# Patient Record
Sex: Female | Born: 1960 | Race: White | Hispanic: No | Marital: Married | Smoking: Never smoker
Health system: Southern US, Community
[De-identification: ages and names within clinical notes are randomized; demographics above are authoritative.]

## PROBLEM LIST (undated history)

## (undated) DIAGNOSIS — I10 Essential (primary) hypertension: Secondary | ICD-10-CM

## (undated) DIAGNOSIS — N951 Menopausal and female climacteric states: Secondary | ICD-10-CM

## (undated) HISTORY — DX: Essential (primary) hypertension: I10

## (undated) HISTORY — DX: Menopausal and female climacteric states: N95.1

---

## 1982-10-01 HISTORY — PX: KNEE ARTHROSCOPY W/ ACL RECONSTRUCTION: SHX1858

## 1995-12-31 HISTORY — PX: TUBAL LIGATION: SHX77

## 2002-01-15 ENCOUNTER — Other Ambulatory Visit: Admission: RE | Admit: 2002-01-15 | Discharge: 2002-01-15 | Payer: Self-pay | Admitting: Obstetrics and Gynecology

## 2003-02-05 ENCOUNTER — Other Ambulatory Visit: Admission: RE | Admit: 2003-02-05 | Discharge: 2003-02-05 | Payer: Self-pay | Admitting: Obstetrics and Gynecology

## 2003-10-02 HISTORY — PX: KNEE ARTHROSCOPY: SUR90

## 2004-02-29 ENCOUNTER — Other Ambulatory Visit: Admission: RE | Admit: 2004-02-29 | Discharge: 2004-02-29 | Payer: Self-pay | Admitting: Internal Medicine

## 2004-03-10 ENCOUNTER — Encounter: Admission: RE | Admit: 2004-03-10 | Discharge: 2004-03-10 | Payer: Self-pay | Admitting: Internal Medicine

## 2005-05-28 ENCOUNTER — Other Ambulatory Visit: Admission: RE | Admit: 2005-05-28 | Discharge: 2005-05-28 | Payer: Self-pay | Admitting: Obstetrics and Gynecology

## 2006-08-20 ENCOUNTER — Other Ambulatory Visit: Admission: RE | Admit: 2006-08-20 | Discharge: 2006-08-20 | Payer: Self-pay | Admitting: Obstetrics and Gynecology

## 2007-08-07 ENCOUNTER — Other Ambulatory Visit: Admission: RE | Admit: 2007-08-07 | Discharge: 2007-08-07 | Payer: Self-pay | Admitting: Internal Medicine

## 2007-09-01 ENCOUNTER — Encounter: Admission: RE | Admit: 2007-09-01 | Discharge: 2007-09-01 | Payer: Self-pay | Admitting: Internal Medicine

## 2008-10-01 HISTORY — PX: ENDOMETRIAL ABLATION W/ NOVASURE: SUR434

## 2008-10-26 ENCOUNTER — Ambulatory Visit: Payer: Self-pay | Admitting: Diagnostic Radiology

## 2008-10-26 ENCOUNTER — Ambulatory Visit (HOSPITAL_BASED_OUTPATIENT_CLINIC_OR_DEPARTMENT_OTHER): Admission: RE | Admit: 2008-10-26 | Discharge: 2008-10-26 | Payer: Self-pay | Admitting: Obstetrics and Gynecology

## 2008-11-16 ENCOUNTER — Ambulatory Visit (HOSPITAL_COMMUNITY): Admission: RE | Admit: 2008-11-16 | Discharge: 2008-11-16 | Payer: Self-pay | Admitting: Obstetrics and Gynecology

## 2008-11-16 ENCOUNTER — Encounter (INDEPENDENT_AMBULATORY_CARE_PROVIDER_SITE_OTHER): Payer: Self-pay | Admitting: Obstetrics and Gynecology

## 2009-04-12 ENCOUNTER — Encounter: Admission: RE | Admit: 2009-04-12 | Discharge: 2009-04-12 | Payer: Self-pay | Admitting: Cardiology

## 2009-10-27 ENCOUNTER — Ambulatory Visit (HOSPITAL_BASED_OUTPATIENT_CLINIC_OR_DEPARTMENT_OTHER): Admission: RE | Admit: 2009-10-27 | Discharge: 2009-10-27 | Payer: Self-pay | Admitting: Obstetrics and Gynecology

## 2009-10-27 ENCOUNTER — Ambulatory Visit: Payer: Self-pay | Admitting: Diagnostic Radiology

## 2009-12-30 IMAGING — CT CT ANGIO ABDOMEN
2 of 8 series · 13 of 36 positions shown, 19 images · IV contrast ([ID] OMNI [id number])
Comparison: None.

CTA ABDOMEN

CLINICAL DATA: Uncontrolled renal vascular hypertension despite
medications

CT ANGIOGRAPHY ABDOMEN
TECHNIQUE: Multidetector CT imaging of the abdomen was performed
using the standard protocol during bolus administration of
intravenous contrast. Multiplanar reconstructed images including
MIPs were obtained and reviewed to evaluate the vascular anatomy.
Contrast: 100 ml Omnipaque 350

[Series 5: arterial, portal venous · axial · arterial · 0.62mm/px · z∈[-260,-55]mm · 9 of 154 slices shown, 15 images]
[im 16/154  soft-tissue]
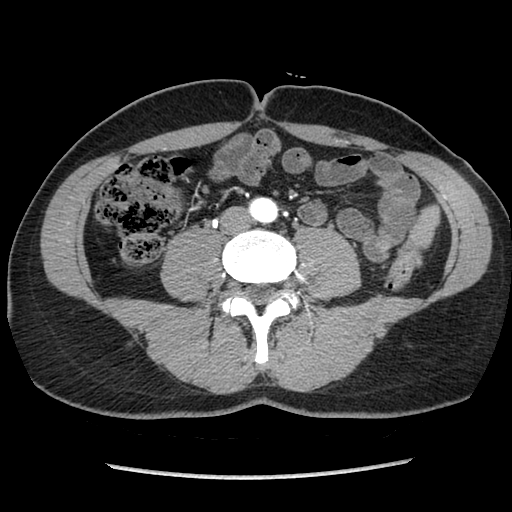
[im 16/154  bone]
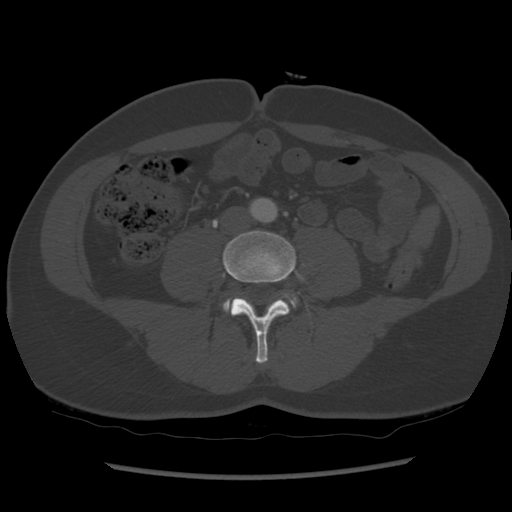
[im 31/154  soft-tissue]
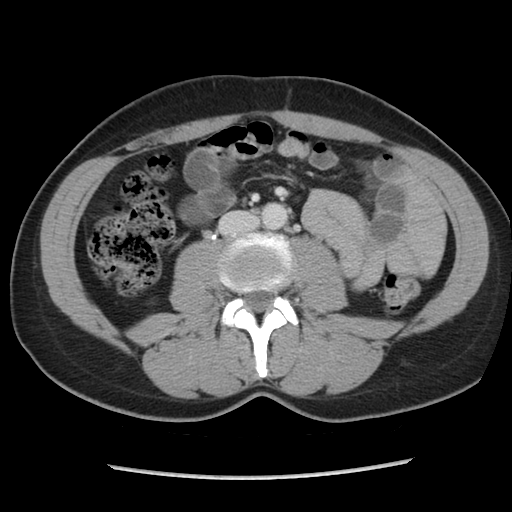
[im 46/154  soft-tissue]
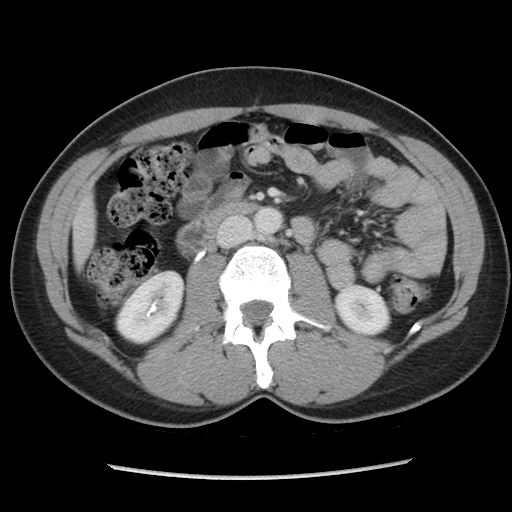
[im 62/154  soft-tissue]
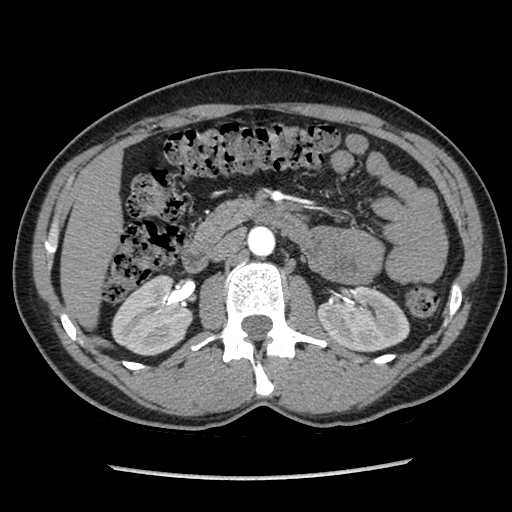
[im 77/154  soft-tissue]
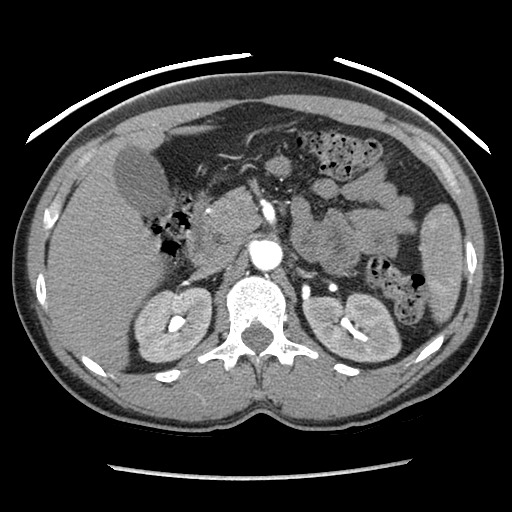
[im 92/154  soft-tissue]
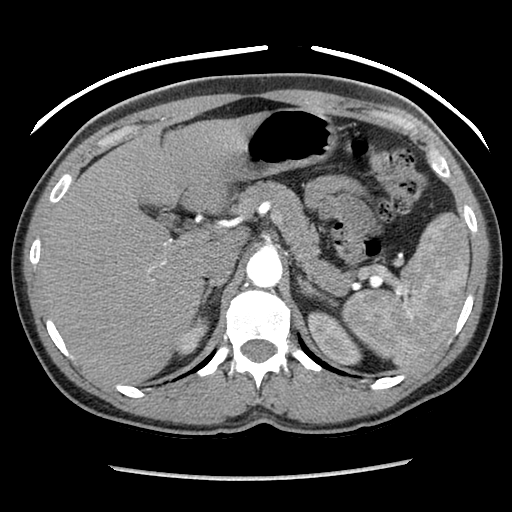
[im 92/154  lung]
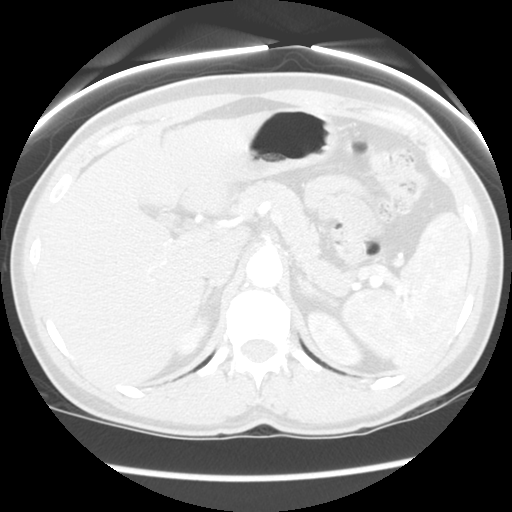
[im 108/154  soft-tissue]
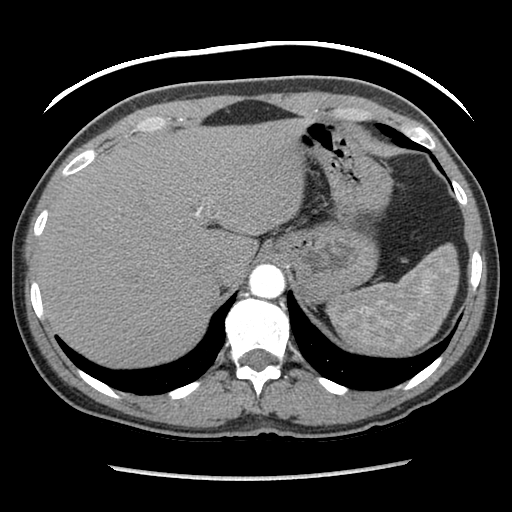
[im 108/154  lung]
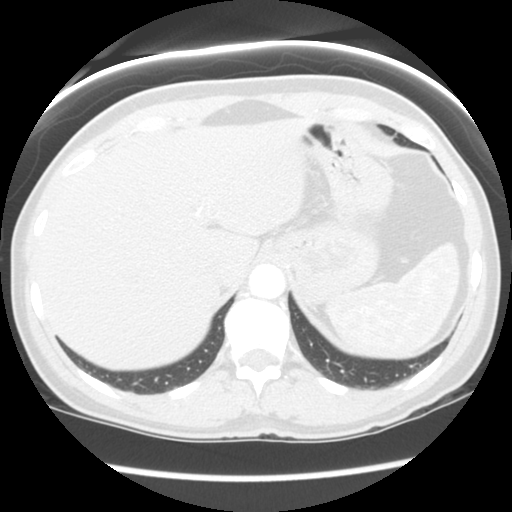
[im 123/154  soft-tissue]
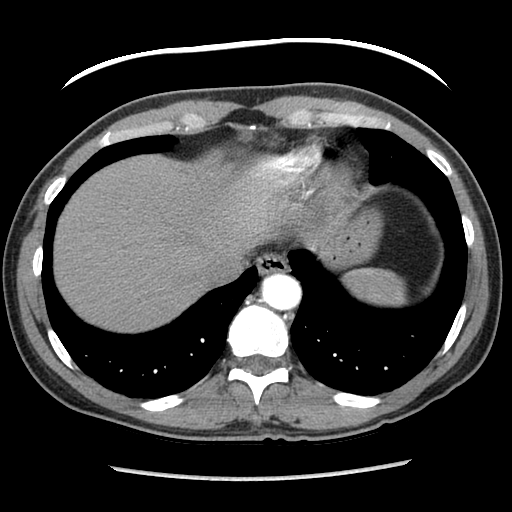
[im 123/154  lung]
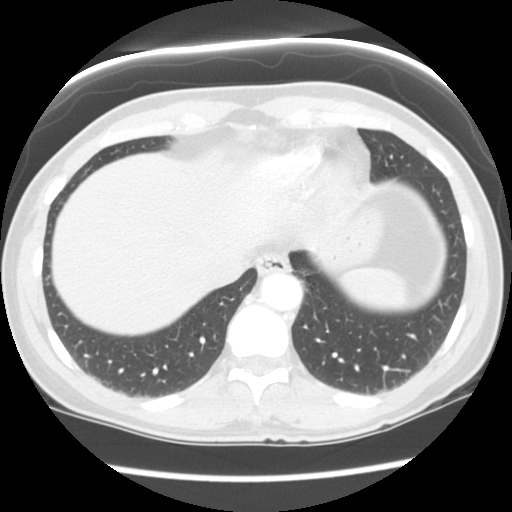
[im 138/154  soft-tissue]
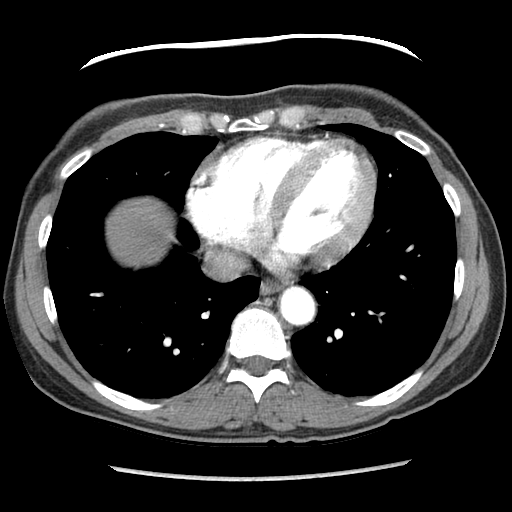
[im 138/154  lung]
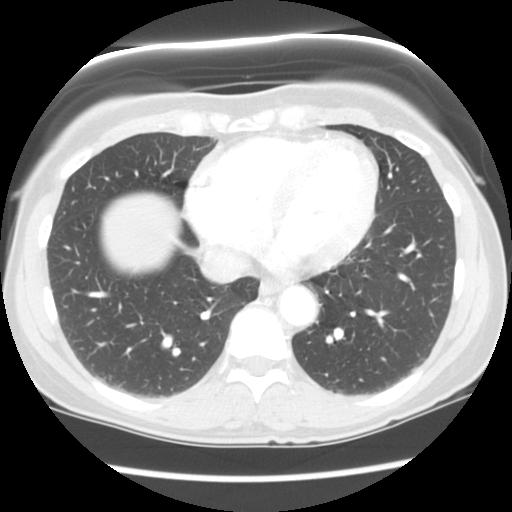
[im 138/154  bone]
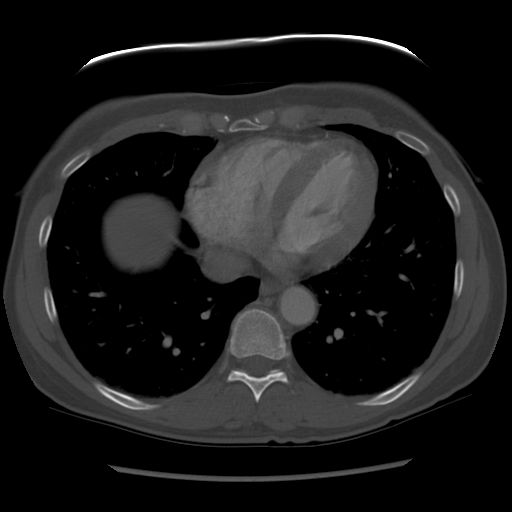

[Series 602: sagittal body · sagittal · 0.62mm/px · 4 of 129 slices shown]
[im 19/129  soft-tissue]
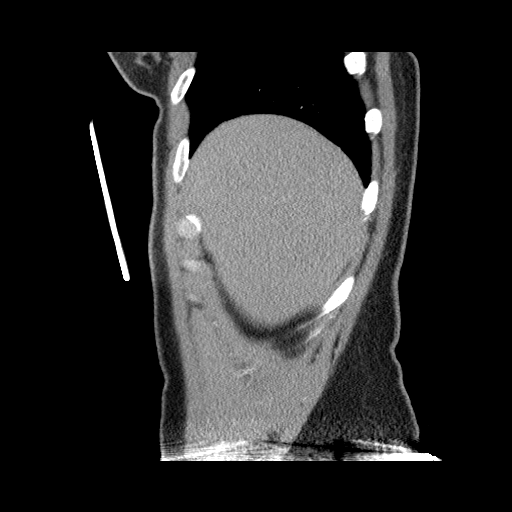
[im 37/129  soft-tissue]
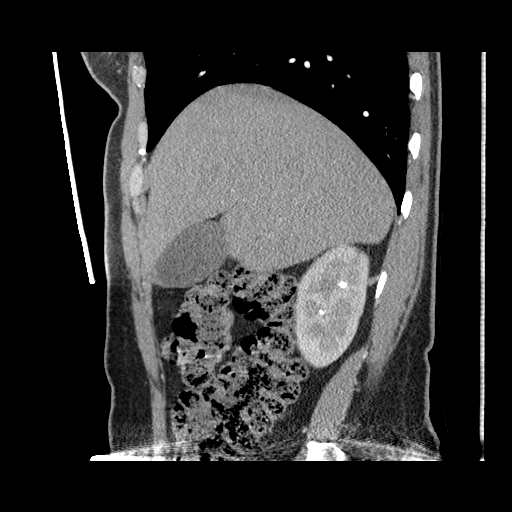
[im 55/129  soft-tissue]
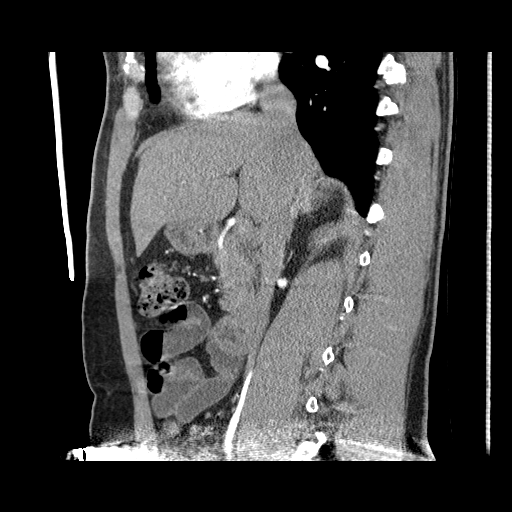
[im 74/129  soft-tissue]
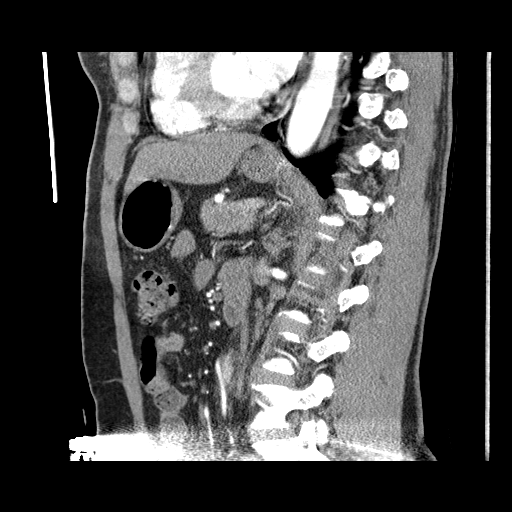

[13 of 36 positions shown; findings below may reference images not displayed]

FINDINGS: Abdominal aorta is patent and normal in caliber.  No
significant atherosclerosis.  Negative for aortic aneurysm,
dissection, occlusive disease, or retroperitoneal hemorrhage.  The
celiac, SMA, and IMA are all patent.  No evidence of proximal
visceral vascular disease.

There are single patent renal arteries.  No evidence of proximal or
ostial renal artery stenosis.  No accessory renal artery
demonstrated.  Kidneys are symmetric in size shape and position.
No renal asymmetry or atrophy.

Additional axial imaging demonstrates no acute finding.  The liver,
gallbladder, biliary system, adrenal glands, pancreas, spleen, and
kidneys are within normal limits for age.  No bowel obstruction,
dilatation, ileus pattern, or free air.

 Review of the MIP images confirms the above findings.
IMPRESSION: Negative for renal artery stenosis by CTA.  Widely patent single
renal arteries.
No significant abdominal atherosclerosis
No acute intra-abdominal finding.

## 2010-10-19 ENCOUNTER — Other Ambulatory Visit (HOSPITAL_BASED_OUTPATIENT_CLINIC_OR_DEPARTMENT_OTHER): Payer: Self-pay | Admitting: Obstetrics and Gynecology

## 2010-10-19 DIAGNOSIS — Z0000 Encounter for general adult medical examination without abnormal findings: Secondary | ICD-10-CM

## 2010-10-22 ENCOUNTER — Encounter: Payer: Self-pay | Admitting: Internal Medicine

## 2010-11-20 ENCOUNTER — Ambulatory Visit (HOSPITAL_BASED_OUTPATIENT_CLINIC_OR_DEPARTMENT_OTHER)
Admission: RE | Admit: 2010-11-20 | Discharge: 2010-11-20 | Disposition: A | Discharge status: Home / Self Care | Payer: 59 | Source: Ambulatory Visit | Attending: Obstetrics and Gynecology | Admitting: Obstetrics and Gynecology

## 2010-11-20 DIAGNOSIS — Z1231 Encounter for screening mammogram for malignant neoplasm of breast: Secondary | ICD-10-CM | POA: Insufficient documentation

## 2010-11-20 DIAGNOSIS — Z0000 Encounter for general adult medical examination without abnormal findings: Secondary | ICD-10-CM

## 2011-01-16 LAB — BASIC METABOLIC PANEL
Chloride: 100 mEq/L (ref 96–112)
Creatinine, Ser: 0.92 mg/dL (ref 0.4–1.2)
GFR calc Af Amer: >60 mL/min (ref >60)
GFR calc non Af Amer: >60 mL/min (ref >60)

## 2011-01-16 LAB — CBC
HCT: 40.2 % (ref 36.0–46.0)
Hemoglobin: 13.5 g/dL (ref 12.0–15.0)
MCV: 85.2 fL (ref 78.0–100.0)
RBC: 4.71 MIL/uL (ref 3.87–5.11)
WBC: 7.3 10*3/uL (ref 4.0–10.5)

## 2011-02-12 ENCOUNTER — Other Ambulatory Visit: Payer: Self-pay | Admitting: Cardiology

## 2011-02-12 DIAGNOSIS — I10 Essential (primary) hypertension: Secondary | ICD-10-CM

## 2011-02-13 NOTE — Op Note (Signed)
NAME:  Darlene Hawkins, Darlene Hawkins NO.:  [id number]   MEDICAL RECORD NO.:  [id number]         PATIENT TYPE:  WAMB   LOCATION:                                FACILITY:  [facility]   PHYSICIAN:  Malva Limes, M.D.    DATE OF BIRTH:  10-23-1960   DATE OF PROCEDURE:  DATE OF DISCHARGE:  10/26/2008                               OPERATIVE REPORT   PREOPERATIVE DIAGNOSES:  1. Menorrhagia.  2. Uterine fibroids.   POSTOPERATIVE DIAGNOSES:  1. Menorrhagia.  2. Uterine fibroids.   PROCEDURE:  1. Hysteroscopy.  2. Dilation and curettage.  3. NovaSure endometrial ablation.   SURGEON:  Anderson   ANESTHESIA:  MAC with paracervical block.   DRAINS:  None.   SPECIMENS:  Endometrial curettings sent to Pathology.   COMPLICATIONS:  None.   ANTIBIOTIC:  Ancef 1 g.   PROCEDURE:  The patient was taken to the operating room where she was  placed in dorsal supine position.  MAC anesthesia was administered.  She  was then placed in dorsal lithotomy position.  She was prepped and  draped in usual fashion for this procedure.  An exam under anesthesia  revealed an anteverted uterus of normal size and shape.  The patient had  previously been noted to have submucous fibroids on ultrasound.  At this  point the cervix was serially dilated to 27-French.  The uterus sounded  to 9 cm.  The hysteroscope was advanced through the endocervical canal  where a small endocervical polyp was seen.  On entering the uterine  cavity, both ostia were visualized with ease.  The patient also had 2  small endometrial polyps.  At this point the hysteroscope was removed  and D&C performed without difficulty.  Specimens were then handed off to  be sent to Pathology.  A NovaSure device was placed into the uterine  cavity and the cervical length was 3 cm given a cavitary length of 6 cm.  The device was opened to 4 cm in width. A seal test was performed and  did not pass.  A Vaseline gauze was placed, around the round  the os and  again this did not allow the pressure test to succeed.  At this point,  this device was removed.  A second device was placed and passed.  The  device was turned on for a total of 48 seconds.  The patient tolerated  the procedure well.  The device was removed.  Hysteroscope was placed  back into the uterine cavity and  appeared to have a very adequate ablation.  At this point the patient  was awoken and taken to recovery room in stable condition.  Instrument  and lab counts were correct x1.  The patient will be discharged to home.  She will follow up in the office in 4 weeks.  The patient will be sent  home with Percocet to take p.r.n.           ______________________________  Malva Limes, M.D.     MA/MEDQ  D:  11/16/2008  T:  11/16/2008  Job:  [id number]

## 2011-02-20 ENCOUNTER — Other Ambulatory Visit: Payer: Self-pay | Admitting: Obstetrics and Gynecology

## 2011-03-21 ENCOUNTER — Other Ambulatory Visit: Payer: Self-pay | Admitting: Cardiology

## 2011-08-29 ENCOUNTER — Other Ambulatory Visit: Payer: Self-pay | Admitting: Nurse Practitioner

## 2011-08-29 ENCOUNTER — Ambulatory Visit (INDEPENDENT_AMBULATORY_CARE_PROVIDER_SITE_OTHER): Payer: 59 | Admitting: Internal Medicine

## 2011-08-29 ENCOUNTER — Encounter: Payer: Self-pay | Admitting: Internal Medicine

## 2011-08-29 VITALS — BP 104/70 | HR 71 | Temp 98.9°F | Ht 62.5 in | Wt 155.0 lb

## 2011-08-29 DIAGNOSIS — E559 Vitamin D deficiency, unspecified: Secondary | ICD-10-CM

## 2011-08-29 DIAGNOSIS — N951 Menopausal and female climacteric states: Secondary | ICD-10-CM

## 2011-08-29 DIAGNOSIS — R454 Irritability and anger: Secondary | ICD-10-CM

## 2011-08-29 DIAGNOSIS — Z780 Asymptomatic menopausal state: Secondary | ICD-10-CM

## 2011-08-29 LAB — CBC WITH DIFFERENTIAL/PLATELET
Basophils Relative: 1 % (ref 0–1)
HCT: 44 % (ref 36.0–46.0)
Lymphocytes Relative: 33 % (ref 12–46)
MCHC: 33.4 g/dL (ref 30.0–36.0)
MCV: 87.6 fL (ref 78.0–100.0)
Neutro Abs: 5 10*3/uL (ref 1.7–7.7)
Platelets: 343 10*3/uL (ref 150–400)
RDW: 12.2 % (ref 11.5–15.5)
WBC: 8.7 10*3/uL (ref 4.0–10.5)

## 2011-08-29 LAB — LIPID PANEL
LDL Cholesterol: 169 mg/dL — ABNORMAL HIGH (ref 0–99)
Triglycerides: 177 mg/dL — ABNORMAL HIGH (ref <150)

## 2011-08-29 LAB — COMPREHENSIVE METABOLIC PANEL
BUN: 15 mg/dL (ref 6–23)
Chloride: 101 mEq/L (ref 96–112)
Potassium: 4.2 mEq/L (ref 3.5–5.3)
Sodium: 136 mEq/L (ref 135–145)
Total Protein: 7.4 g/dL (ref 6.0–8.3)

## 2011-08-29 MED ORDER — BUPROPION HCL ER (XL) 150 MG PO TB24: ORAL_TABLET | ORAL | Status: DC

## 2011-08-29 NOTE — Patient Instructions (Signed)
See me in February  Labs will be mailed to you  Take Wellbutrin once a day for 2 weeks and then can go to two times a day

## 2011-08-30 ENCOUNTER — Encounter: Payer: Self-pay | Admitting: Emergency Medicine

## 2011-08-30 ENCOUNTER — Encounter: Payer: Self-pay | Admitting: Internal Medicine

## 2011-08-30 DIAGNOSIS — I10 Essential (primary) hypertension: Secondary | ICD-10-CM | POA: Insufficient documentation

## 2011-08-30 DIAGNOSIS — N951 Menopausal and female climacteric states: Secondary | ICD-10-CM | POA: Insufficient documentation

## 2011-08-30 DIAGNOSIS — E559 Vitamin D deficiency, unspecified: Secondary | ICD-10-CM | POA: Insufficient documentation

## 2011-08-30 DIAGNOSIS — E785 Hyperlipidemia, unspecified: Secondary | ICD-10-CM | POA: Insufficient documentation

## 2011-08-30 LAB — VITAMIN D 25 HYDROXY (VIT D DEFICIENCY, FRACTURES): Vit D, 25-Hydroxy: 42 ng/mL (ref 30–89)

## 2011-08-30 NOTE — Progress Notes (Signed)
Subjective:    Patient ID: Darlene Hawkins, female    DOB: [birth year], 50 y.o.   MRN: [id number]  HPI New pt here for first visit.  PMH of HTN approx 3 years, menorrhagia tx with NOvasure, and Vitamin D deficiency.   Darlene Hawkins reports she has not had a menstrual lcycle in 2 years.  She has occasional hot flushes daytime only not at night  She is concerned over her irritable and angry moods over last 6-7 months.  She is depressed at times,  No anhedonia.  She gets short tempered at home and this bothers her.  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  She is wondering if Wellbutrin will help with mood.  She denies anxiety and she denies psychotic features.  She also describes low libido and has tried testosterone injections in the past but does not wish to continue this  She has Been Vit D deficient in the past and would like this chekced  No Known Allergies Past Medical History  Diagnosis Date  . Hypertension   . Menopause syndrome   . Vitamin D deficiency    Past Surgical History  Procedure Date  . Knee arthroscopy 2005    Left  . Cesarean section 6/95, 4/97  . Tubal ligation 4/97  . Endometrial ablation w/ novasure 2010  . Knee arthroscopy w/ acl reconstruction 1984    left   History   Social History  . Marital Status: Married    Spouse Name: N/A    Number of Children: N/A  . Years of Education: N/A   Occupational History  . Not on file.   Social History Main Topics  . Smoking status: Never Smoker   . Smokeless tobacco: Never Used  . Alcohol Use: 2.4 oz/week    4 Glasses of wine per week  . Drug Use: No  . Sexually Active: Yes    Birth Control/ Protection: Surgical   Other Topics Concern  . Not on file   Social History Narrative  . No narrative on file   Family History  Problem Relation Age of Onset  . Hypertension Mother   . Osteoporosis Mother   . Alcohol abuse Maternal  Grandfather   . Cirrhosis Maternal Grandfather   . Cancer Paternal Grandmother     lung  . Heart disease Paternal Grandfather    Patient Active Problem List  Diagnoses  . Hypertension  . Menopause syndrome  . Vitamin D deficiency   Current Outpatient Prescriptions on File Prior to Visit  Medication Sig Dispense Refill  . lisinopril (PRINIVIL,ZESTRIL) 5 MG tablet TAKE ONE TABLET BY MOUTH EVERY DAY  30 tablet  0       Review of Systems See HPI    Objective:   Physical Exam Physical Exam  Nursing note and vitals reviewed.  Constitutional: She is oriented to person, place, and time. She appears well-developed and well-nourished.  HENT:  Head: Normocephalic and atraumatic.  Cardiovascular: Normal rate and regular rhythm. Exam reveals no gallop and no friction rub.  No murmur heard.  Pulmonary/Chest: Breath sounds normal. She has no wheezes. She has no rales.  Neurological: She is alert and oriented to person, place, and time.  Skin: Skin is warm and dry.  Psychiatric: She has a normal mood and affect. Her behavior is normal.         Assessment & Plan:  1)  Irrtability/job stress.   Discussed SE profile of Wellbutrin.  She does not have a seizure disorder history.  Job stress  Certainly a component of irrtability but OK to try short course of WElllbutrin  150 mg  For two weeks then bid.  See me in 2-3 months.  Check TSH , labs today 2)   Vit D deficiency  Will check today 3) Menopause

## 2011-09-26 ENCOUNTER — Other Ambulatory Visit: Payer: Self-pay | Admitting: Emergency Medicine

## 2011-09-26 DIAGNOSIS — R454 Irritability and anger: Secondary | ICD-10-CM

## 2011-09-26 MED ORDER — BUPROPION HCL ER (XL) 150 MG PO TB24: ORAL_TABLET | ORAL | Status: DC

## 2011-09-26 NOTE — Telephone Encounter (Signed)
Darlene Hawkins called, requests refills of bupropion be sent to [facility] order, 90 day supply

## 2011-10-18 ENCOUNTER — Telehealth: Payer: Self-pay | Admitting: Emergency Medicine

## 2011-10-18 DIAGNOSIS — E785 Hyperlipidemia, unspecified: Secondary | ICD-10-CM

## 2011-10-18 NOTE — Telephone Encounter (Signed)
Darlene Hawkins called and wanted to know if she could come in fasting for her cholesterol check prior to her schedule afternoon CPE.  I told her I would enter the order and she could go to the lab at her convenience for lab draw.

## 2011-10-28 ENCOUNTER — Encounter: Payer: Self-pay | Admitting: Internal Medicine

## 2011-10-28 DIAGNOSIS — N920 Excessive and frequent menstruation with regular cycle: Secondary | ICD-10-CM | POA: Insufficient documentation

## 2011-11-03 LAB — LIPID PANEL
Cholesterol: 216 mg/dL — ABNORMAL HIGH (ref 0–200)
HDL: 44 mg/dL (ref >39)
LDL Cholesterol: 148 mg/dL — ABNORMAL HIGH (ref 0–99)
Total CHOL/HDL Ratio: 4.9 Ratio
Triglycerides: 120 mg/dL (ref <150)
VLDL: 24 mg/dL (ref 0–40)

## 2011-11-07 ENCOUNTER — Encounter: Payer: Self-pay | Admitting: Internal Medicine

## 2011-11-07 ENCOUNTER — Ambulatory Visit (INDEPENDENT_AMBULATORY_CARE_PROVIDER_SITE_OTHER): Payer: 59 | Admitting: Internal Medicine

## 2011-11-07 VITALS — BP 110/70 | HR 104 | Temp 99.4°F | Ht 62.5 in | Wt 154.1 lb

## 2011-11-07 DIAGNOSIS — E785 Hyperlipidemia, unspecified: Secondary | ICD-10-CM

## 2011-11-07 DIAGNOSIS — R635 Abnormal weight gain: Secondary | ICD-10-CM

## 2011-11-07 DIAGNOSIS — I10 Essential (primary) hypertension: Secondary | ICD-10-CM

## 2011-11-07 NOTE — Patient Instructions (Signed)
Will refer to Dr. Kinnie Scales  Return prn

## 2011-11-07 NOTE — Progress Notes (Signed)
  Subjective:    Patient ID: Darlene Hawkins, female    DOB: June 05, 1961, 51 y.o.   MRN: [id number]  HPI Darlene Hawkins is here for follow up.  She stopped the Wellbutrin because she felt it made her more forgetful and scattered.  She knows she needs to find a new job as this is very stressful for her.    See lipids.  They have improved from 2 months ago and she would like to try Fish oil instead of a prescription statin.  She also would like weight loss counseling  No Known Allergies Past Medical History  Diagnosis Date  . Hypertension   . Menopause syndrome   . Vitamin d deficiency    Past Surgical History  Procedure Date  . Knee arthroscopy 2005    Left  . Cesarean section 6/95, 4/97  . Tubal ligation 4/97  . Endometrial ablation w/ novasure 2010  . Knee arthroscopy w/ acl reconstruction 1984    left   History   Social History  . Marital Status: Married    Spouse Name: N/A    Number of Children: N/A  . Years of Education: N/A   Occupational History  . Not on file.   Social History Main Topics  . Smoking status: Never Smoker   . Smokeless tobacco: Never Used  . Alcohol Use: 2.4 oz/week    4 Glasses of wine per week  . Drug Use: No  . Sexually Active: Yes    Birth Control/ Protection: Surgical   Other Topics Concern  . Not on file   Social History Narrative  . No narrative on file   Family History  Problem Relation Age of Onset  . Hypertension Mother   . Osteoporosis Mother   . Alcohol abuse Maternal Grandfather   . Cirrhosis Maternal Grandfather   . Cancer Paternal Grandmother     lung  . Heart disease Paternal Grandfather    Patient Active Problem List  Diagnoses  . Hypertension  . Menopause syndrome  . Vitamin d deficiency  . Hyperlipidemia  . Menorrhagia   Current Outpatient Prescriptions on File Prior to Visit  Medication Sig Dispense Refill  . lisinopril (PRINIVIL,ZESTRIL) 5 MG tablet TAKE ONE TABLET BY MOUTH EVERY DAY  30 tablet  0  . buPROPion  (WELLBUTRIN XL) 150 MG 24 hr tablet Take one tablet every morning for 2 weeks then take one tablet twice a day  180 tablet  0       Review of Systems    see HPI Objective:   Physical Exam  Physical Exam  Nursing note and vitals reviewed.  Constitutional: She is oriented to person, place, and time. She appears well-developed and well-nourished.  HENT:  Head: Normocephalic and atraumatic.  Cardiovascular: Normal rate and regular rhythm. Exam reveals no gallop and no friction rub.  No murmur heard.  Pulmonary/Chest: Breath sounds normal. She has no wheezes. She has no rales.  Neurological: She is alert and oriented to person, place, and time.  Skin: Skin is warm and dry.  Psychiatric: She has a normal mood and affect. Her behavior is normal.        Assessment & Plan:  1)  Irrtiability:  No meds now.  She does not wish counseling for this now 2)  Hyperlipidemia:  OK to take fish oil for now, follow DASH diet  3)  Weight gain  Will refer to Dr. Kinnie Scales  4)  HTN  Well controlled

## 2011-12-26 ENCOUNTER — Telehealth: Payer: Self-pay | Admitting: Emergency Medicine

## 2011-12-26 NOTE — Telephone Encounter (Signed)
Darlene Hawkins called, left message yesterday.  She states that she was referred to Dr. Kinnie Scales for nutritional counseling, however, it will be difficult for her to get to his office in downtown [place] from her current job because she works in [employer].  She would like to know if [facility] has someone else she can recommend for her to see that is closer to the [facility] area?  Darlene Hawkins works and lives in [place].

## 2011-12-27 NOTE — Telephone Encounter (Signed)
Darlene Hawkins may have already spoke with pt.  Check with her to see if advised already

## 2012-03-05 ENCOUNTER — Encounter: Payer: Self-pay | Admitting: Cardiology

## 2014-07-09 ENCOUNTER — Other Ambulatory Visit: Payer: Self-pay | Admitting: Obstetrics and Gynecology

## 2014-07-12 LAB — CYTOLOGY - PAP

## 2014-08-02 ENCOUNTER — Encounter: Payer: Self-pay | Admitting: Internal Medicine

## 2015-08-04 ENCOUNTER — Telehealth: Payer: Self-pay | Admitting: Internal Medicine

## 2015-08-04 NOTE — Telephone Encounter (Signed)
Received records from [facility] for appointment on 08/18/15 with Dr Rennis GoldenHilty.  [facility]. lp

## 2015-08-18 ENCOUNTER — Encounter: Payer: Self-pay | Admitting: Internal Medicine

## 2015-08-18 ENCOUNTER — Ambulatory Visit (INDEPENDENT_AMBULATORY_CARE_PROVIDER_SITE_OTHER): Payer: 59 | Admitting: Internal Medicine

## 2015-08-18 VITALS — BP 118/94 | HR 73 | Ht 63.0 in | Wt 158.0 lb

## 2015-08-18 DIAGNOSIS — E785 Hyperlipidemia, unspecified: Secondary | ICD-10-CM

## 2015-08-18 DIAGNOSIS — I10 Essential (primary) hypertension: Secondary | ICD-10-CM | POA: Diagnosis not present

## 2015-08-18 NOTE — Patient Instructions (Signed)
Dr. Rennis GoldenHilty has ordered a coronary calcium score - this is done @ [address] - [address]  Your physician recommends that you schedule a follow-up appointment after your test.

## 2015-08-18 NOTE — Progress Notes (Signed)
OFFICE NOTE  Chief Complaint:  Abnormal cholesterol profile  Primary Care Physician: No PCP Per Patient  HPI:  Darlene Hawkins is a pleasant 54 year old female is coming referred to me by Dr. cousins for evaluation of an abnormal cholesterol profile. Her past medical history is limited to mild hypertension on low-dose lisinopril. There is a remote history of coronary disease in her grandfather but no first-degree relatives with any known coronary disease. She is generally physically active but has had an elevated cholesterol in the past. Recent cholesterol testing shows an LDL particle number of 1940, LDL-C of 171, age 75-C of 64, tragus rides 152 and total cholesterol 248. She reports being asymptomatic with exercise and denies any chest pain, shortness of breath or fatigue with exercise. She has no palpitations or any other worrisome symptoms.  PMHx:  Past Medical History  Diagnosis Date  . Hypertension   . Menopause syndrome   . Vitamin D deficiency     Past Surgical History  Procedure Laterality Date  . Knee arthroscopy  2005    Left  . Cesarean section  6/95, 4/97  . Tubal ligation  4/97  . Endometrial ablation w/ novasure  2010  . Knee arthroscopy w/ acl reconstruction  1984    left    FAMHx:  Family History  Problem Relation Age of Onset  . Hypertension Mother   . Osteoporosis Mother   . Alcohol abuse Maternal Grandfather   . Cirrhosis Maternal Grandfather   . Cancer Paternal Grandmother     lung  . Heart disease Paternal Grandfather     SOCHx:   reports that she has never smoked. She has never used smokeless tobacco. She reports that she drinks about 2.4 oz of alcohol per week. She reports that she does not use illicit drugs.  ALLERGIES:  No Known Allergies  ROS: A comprehensive review of systems was negative.  HOME MEDS: Current Outpatient Prescriptions  Medication Sig Dispense Refill  . lisinopril (PRINIVIL,ZESTRIL) 5 MG tablet TAKE ONE TABLET BY  MOUTH EVERY DAY 30 tablet 0   No current facility-administered medications for this visit.    LABS/IMAGING: No results found for this or any previous visit (from the past 48 hour(s)). No results found.  WEIGHTS: Wt Readings from Last 3 Encounters:  08/18/15 158 lb (71.668 kg)  11/07/11 154 lb 1.3 oz (69.89 kg)  08/29/11 155 lb (70.308 kg)    VITALS: BP 118/94 mmHg  Pulse 73  Ht  (1.6 m)  Wt 158 lb (71.668 kg)  BMI 28.00 kg/m2  EXAM: General appearance: alert and no distress Neck: no carotid bruit and no JVD Lungs: clear to auscultation bilaterally Heart: regular rate and rhythm, S1, S2 normal, no murmur, click, rub or gallop Abdomen: soft, non-tender; bowel sounds normal; no masses,  no organomegaly Extremities: extremities normal, atraumatic, no cyanosis or edema Pulses: 2+ and symmetric Skin: Skin color, texture, turgor normal. No rashes or lesions Neurologic: Grossly normal Psych: Pleasant  EKG: Normal sinus rhythm at 73  ASSESSMENT: 1. Hypertension-controlled 2. Dyslipidemia  PLAN: 1.   Mrs. Mantey has an elevated cholesterol profile with a high particle number of 1940 and LDL C of 171. Cardiac risk factors include age (postmenopausal) and hypertension which is well-controlled on low-dose lisinopril. We discussed the possible risks and benefits of treatment with statin therapy. It may be helpful to get more evidence as to whether or not she's developed any premature coronary artery disease. I offered a CT calcium  score, which may be helpful and minimally invasive. If this is zero (0), then we could argue to not be as aggressive with cholesterol treatment. If there are significant coronary artery calcifications, then more than moderate reduction of cholesterol would be indicated based on intermediate risk.  Plan to see her back to discuss the results of her coronary artery calcium score in the near future. In the meantime, I recommended dietary changes which may  be helpful in lowering cholesterol as well as restarting a more regular exercise program.   Thanks as always for the kind referral.  [name]. Andre Gallego, MD, [facility] CentersFACC Attending Cardiologist [facility]  [name] Hjalmar Ballengee 08/18/2015, 11:06 AM

## 2015-08-24 ENCOUNTER — Ambulatory Visit (INDEPENDENT_AMBULATORY_CARE_PROVIDER_SITE_OTHER)
Admission: RE | Admit: 2015-08-24 | Discharge: 2015-08-24 | Disposition: A | Discharge status: Home / Self Care | Payer: 59 | Source: Ambulatory Visit | Attending: Internal Medicine | Admitting: Internal Medicine

## 2015-08-24 DIAGNOSIS — I10 Essential (primary) hypertension: Secondary | ICD-10-CM

## 2015-08-24 DIAGNOSIS — E785 Hyperlipidemia, unspecified: Secondary | ICD-10-CM

## 2015-08-29 ENCOUNTER — Encounter: Payer: Self-pay | Admitting: Internal Medicine

## 2015-08-29 NOTE — Telephone Encounter (Signed)
Returning your call. °

## 2015-08-29 NOTE — Telephone Encounter (Signed)
Pt would like her test results from 08-24-15 please.

## 2015-08-29 NOTE — Telephone Encounter (Signed)
This encounter was created in error - please disregard.

## 2015-10-12 ENCOUNTER — Ambulatory Visit (INDEPENDENT_AMBULATORY_CARE_PROVIDER_SITE_OTHER): Payer: 59 | Admitting: Internal Medicine

## 2015-10-12 VITALS — BP 132/82 | HR 70 | Ht 63.0 in | Wt 157.0 lb

## 2015-10-12 DIAGNOSIS — I10 Essential (primary) hypertension: Secondary | ICD-10-CM

## 2015-10-12 DIAGNOSIS — R931 Abnormal findings on diagnostic imaging of heart and coronary circulation: Secondary | ICD-10-CM

## 2015-10-12 DIAGNOSIS — E785 Hyperlipidemia, unspecified: Secondary | ICD-10-CM

## 2015-10-12 DIAGNOSIS — Z79899 Other long term (current) drug therapy: Secondary | ICD-10-CM | POA: Diagnosis not present

## 2015-10-12 DIAGNOSIS — R938 Abnormal findings on diagnostic imaging of other specified body structures: Secondary | ICD-10-CM | POA: Diagnosis not present

## 2015-10-12 MED ORDER — ROSUVASTATIN CALCIUM 10 MG PO TABS: 10.0000 mg | ORAL_TABLET | Freq: Every day | ORAL | Status: DC

## 2015-10-12 MED ORDER — LISINOPRIL 5 MG PO TABS: 5.0000 mg | ORAL_TABLET | Freq: Every day | ORAL | Status: DC

## 2015-10-12 NOTE — Patient Instructions (Signed)
Your physician has recommended you make the following change in your medication: START CRESTOR 10 MG DAILY  Your physician recommends that you return for lab work in:   ONE WEEK CMET AT [facility]  3 MONTHS AT [facility] FASTING FOR NMR LIPID PANEL  Your physician recommends that you schedule a follow-up appointment in: 3-4 MONTHS WITH DR. HILTY

## 2015-10-13 ENCOUNTER — Encounter: Payer: Self-pay | Admitting: Internal Medicine

## 2015-10-13 DIAGNOSIS — R931 Abnormal findings on diagnostic imaging of heart and coronary circulation: Secondary | ICD-10-CM | POA: Insufficient documentation

## 2015-10-13 NOTE — Progress Notes (Signed)
OFFICE NOTE  Chief Complaint:  Follow-up coronary calcium scan  Primary Care Physician: Darlene Kyle, MD  HPI:  Darlene Hawkins is a pleasant 55 year old female is coming referred to me by Dr. cousins for evaluation of an abnormal cholesterol profile. Her past medical history is limited to mild hypertension on low-dose lisinopril. There is a remote history of coronary disease in her grandfather but no first-degree relatives with any known coronary disease. She is generally physically active but has had an elevated cholesterol in the past. Recent cholesterol testing shows an LDL particle number of 1940, LDL-Hawkins of 171, HDL 47, triglycerides 152 and total cholesterol 248. She reports being asymptomatic with exercise and denies any chest pain, shortness of breath or fatigue with exercise. She has no palpitations or any other worrisome symptoms.  Darlene Hawkins returns today for follow-up of her coronary artery calcium score. This demonstrated no extracardiac findings. She had a small punctate area of calcium in the mid LAD. The calcium score was 1.5, which is 78th percentile for age and sex matched controls. We discussed the relevance of some mild coronary artery calcium, which may not be that atypical given her age in the 39s, but does suggest coronary disease.  I think she would benefit from a 50% reduction in cholesterol given the fact that she has at least intermediate coronary risk was some evidence of coronary artery disease. I would therefore recommend starting a low-dose statin and increasing that as tolerated.  PMHx:  Past Medical History  Diagnosis Date  . Hypertension   . Menopause syndrome   . Vitamin D deficiency     Past Surgical History  Procedure Laterality Date  . Knee arthroscopy  2005    Left  . Cesarean section  6/95, 4/97  . Tubal ligation  4/97  . Endometrial ablation w/ novasure  2010  . Knee arthroscopy w/ acl reconstruction  1984    left    FAMHx:    Family History  Problem Relation Age of Onset  . Hypertension Mother   . Osteoporosis Mother   . Alcohol abuse Maternal Grandfather   . Cirrhosis Maternal Grandfather     liver cancer  . Lung cancer Paternal Grandmother   . Heart disease Paternal Grandfather     SOCHx:   reports that she has never smoked. She has never used smokeless tobacco. She reports that she drinks about 2.4 oz of alcohol per week. She reports that she does not use illicit drugs.  ALLERGIES:  No Known Allergies  ROS: A comprehensive review of systems was negative.  HOME MEDS: Current Outpatient Prescriptions  Medication Sig Dispense Refill  . lisinopril (PRINIVIL,ZESTRIL) 5 MG tablet Take 1 tablet (5 mg total) by mouth daily. 90 tablet 3  . rosuvastatin (CRESTOR) 10 MG tablet Take 1 tablet (10 mg total) by mouth daily. 90 tablet 3   No current facility-administered medications for this visit.    LABS/IMAGING: No results found for this or any previous visit (from the past 48 hour(s)). No results found.  WEIGHTS: Wt Readings from Last 3 Encounters:  10/12/15 157 lb (71.215 kg)  08/18/15 158 lb (71.668 kg)  11/07/11 154 lb 1.3 oz (69.89 kg)    VITALS: BP 132/82 mmHg  Pulse 70  Ht 5\' 3"  (1.6 m)  Wt 157 lb (71.215 kg)  BMI 27.82 kg/m2  EXAM: Deferred  EKG: Deferred  ASSESSMENT: 1. CAC of 1.5, mid LAD (2016) 2. Hypertension-controlled 3. Dyslipidemia  PLAN: 1.  Darlene Hawkins does have a small amount of coronary artery calcium. Given her intermediate cardiac risk, I would recommend treatment of her dyslipidemia with a goal of 50% reduction in cholesterol. Her most recent LDL was over 170. I would recommend starting Crestor 10 mg daily. We can increase that as necessary to reach a goal LDL of ideally less than 100. Plan tests of liver function in 1-2 weeks and a repeat lipid profile in 3 months.  Darlene NoseKenneth Hawkins. Darlene Voong, MD, [facility] Ltd[facility]ACC Attending Cardiologist [facility]  Darlene NoseKenneth Hawkins  Darlene Hawkins 10/13/2015, 11:23 AM

## 2015-10-20 LAB — COMPREHENSIVE METABOLIC PANEL
ALT: 22 U/L (ref 6–29)
AST: 23 U/L (ref 10–35)
Albumin: 4.4 g/dL (ref 3.6–5.1)
Alkaline Phosphatase: 60 U/L (ref 33–130)
BUN: 15 mg/dL (ref 7–25)
CHLORIDE: 103 mmol/L (ref 98–110)
CO2: 28 mmol/L (ref 20–31)
Calcium: 9.7 mg/dL (ref 8.6–10.4)
Creat: 1.01 mg/dL (ref 0.50–1.05)
GLUCOSE: 95 mg/dL (ref 65–99)
POTASSIUM: 4.6 mmol/L (ref 3.5–5.3)
Sodium: 139 mmol/L (ref 135–146)
Total Bilirubin: 0.7 mg/dL (ref 0.2–1.2)
Total Protein: 7.1 g/dL (ref 6.1–8.1)

## 2015-11-03 ENCOUNTER — Telehealth: Payer: Self-pay | Admitting: Internal Medicine

## 2015-11-03 NOTE — Telephone Encounter (Signed)
New message      Pt c/o medication issue:  1. Name of Medication: generic crestor 2. How are you currently taking this medication (dosage and times per day)? 10 mg daily  3. Are you having a reaction (difficulty breathing--STAT)? no  4. What is your medication issue?  Medication is making pt joints and muscle hurt at night, constipation----she does not like this medication

## 2015-11-03 NOTE — Telephone Encounter (Signed)
Could go one of two routes. Could restart Crestor at 10 mg twice weekly to see if she tolerates it, or try pravastatin, but you'd probably need 40 mg to get her LDL down.

## 2015-11-03 NOTE — Telephone Encounter (Signed)
Pt experiencing joint & leg pain, GI upset/constipation after 2 weeks on crestor. Advised OK to d/c - plan to be off for 2 weeks - we can make recommendation for lower dosage or different med.   Pt voiced understanding.  Routed for review.

## 2015-11-04 NOTE — Telephone Encounter (Signed)
Agree .. hold statin for 2 weeks. If symptoms better, would recommend pravastatin 40 mg daily if she was willing to take it.  Dr. Rexene Edison

## 2015-12-06 ENCOUNTER — Telehealth: Payer: Self-pay | Admitting: Internal Medicine

## 2015-12-06 NOTE — Telephone Encounter (Signed)
Left message for pt to call.

## 2015-12-06 NOTE — Telephone Encounter (Signed)
New message      Pt stopped her cholesterol medication weeks ago.  Should she have a cholesterol blood test?

## 2015-12-09 NOTE — Telephone Encounter (Signed)
Spoke to patient Patient states she has been off the Crestor for @ 2 weeks  Per patient states symptoms got better in 2-3 days. She prefer not to try another Statin medications at present time -diet and exercise, with use of fish oil. She would like to still have labs done in April 2017

## 2015-12-09 NOTE — Telephone Encounter (Signed)
Follow up ° ° ° ° ° °Returning a call to the nurse °

## 2015-12-12 ENCOUNTER — Telehealth: Payer: Self-pay | Admitting: Internal Medicine

## 2015-12-12 DIAGNOSIS — E785 Hyperlipidemia, unspecified: Secondary | ICD-10-CM

## 2015-12-12 NOTE — Telephone Encounter (Signed)
[facility] entered in orders, [facility]

## 2015-12-12 NOTE — Telephone Encounter (Signed)
New message      Calling to get order for lab work.  Need order for lipomed test. Is she supposed to have a cardiac IQ?  Please fax to [phone]

## 2016-01-07 LAB — CARDIO IQ(R) ADVANCED LIPID PANEL
Apolipoprotein B: 115 mg/dL — ABNORMAL HIGH (ref 49–103)
CHOLESTEROL/HDL RATIO (CARDIO IQ ADV LIPID PANEL): 5.1 calc — AB (ref <=5.0)
Cholesterol, Total: 233 mg/dL — ABNORMAL HIGH (ref 125–200)
HDL Cholesterol: 46 mg/dL (ref >=46)
LDL Large: 4885 nmol/L — ABNORMAL LOW (ref 5038–17886)
LDL Medium: 398 nmol/L — ABNORMAL HIGH (ref 121–397)
LDL Particle Number: 1664 nmol/L (ref 1016–2185)
LDL Peak Size: 212.1 Angstrom — ABNORMAL LOW (ref >=218.2)
LDL SMALL: 404 nmol/L — AB (ref 115–386)
LDL, Calculated: 159 mg/dL — ABNORMAL HIGH
Non-HDL Cholesterol: 187 mg/dL
TRIGLYCERIDES (CARDIO IQ ADV LIPID PANEL): 139 mg/dL

## 2016-01-11 ENCOUNTER — Telehealth: Payer: Self-pay | Admitting: Internal Medicine

## 2016-01-11 NOTE — Telephone Encounter (Signed)
New message ° ° ° ° ° °Returning a call to the nurse to get test results °

## 2016-01-11 NOTE — Telephone Encounter (Signed)
Patient called with lab results.

## 2016-01-19 ENCOUNTER — Encounter: Payer: Self-pay | Admitting: Internal Medicine

## 2016-01-19 ENCOUNTER — Ambulatory Visit (INDEPENDENT_AMBULATORY_CARE_PROVIDER_SITE_OTHER): Payer: 59 | Admitting: Internal Medicine

## 2016-01-19 VITALS — BP 112/94 | HR 86 | Ht 63.0 in | Wt 160.5 lb

## 2016-01-19 DIAGNOSIS — Z79899 Other long term (current) drug therapy: Secondary | ICD-10-CM

## 2016-01-19 DIAGNOSIS — I10 Essential (primary) hypertension: Secondary | ICD-10-CM | POA: Diagnosis not present

## 2016-01-19 DIAGNOSIS — R938 Abnormal findings on diagnostic imaging of other specified body structures: Secondary | ICD-10-CM

## 2016-01-19 DIAGNOSIS — E785 Hyperlipidemia, unspecified: Secondary | ICD-10-CM | POA: Diagnosis not present

## 2016-01-19 MED ORDER — PRAVASTATIN SODIUM 40 MG PO TABS: 40.0000 mg | ORAL_TABLET | Freq: Every evening | ORAL | Status: DC

## 2016-01-19 NOTE — Patient Instructions (Signed)
Your physician has recommended you make the following change in your medication...  1. START pravastatin 40mg  once daily 2. Take Co-Q10 100mg  once daily  Your physician recommends that you return for lab work in ONE WEEK - CK, CMET  Your physician recommends that you return for lab work in 3 months fasting to recheck cholesterol   Your physician wants you to follow-up in: 3 months with Dr. Rennis GoldenHilty. You will receive a reminder letter in the mail two months in advance. If you don't receive a letter, please call our office to schedule the follow-up appointment.

## 2016-01-19 NOTE — Progress Notes (Signed)
OFFICE NOTE  Chief Complaint:  Follow-up labs  Primary Care Physician: Serita Kyle, MD  HPI:  Darlene Hawkins is a pleasant 55 year old female is coming referred to me by Dr. cousins for evaluation of an abnormal cholesterol profile. Her past medical history is limited to mild hypertension on low-dose lisinopril. There is a remote history of coronary disease in her grandfather but no first-degree relatives with any known coronary disease. She is generally physically active but has had an elevated cholesterol in the past. Recent cholesterol testing shows an LDL particle number of 1940, LDL-C of 171, HDL 47, triglycerides 152 and total cholesterol 248. She reports being asymptomatic with exercise and denies any chest pain, shortness of breath or fatigue with exercise. She has no palpitations or any other worrisome symptoms.  Darlene Hawkins returns today for follow-up of her coronary artery calcium score. This demonstrated no extracardiac findings. She had a small punctate area of calcium in the mid LAD. The calcium score was 1.5, which is 78th percentile for age and sex matched controls. We discussed the relevance of some mild coronary artery calcium, which may not be that atypical given her age in the 20s, but does suggest coronary disease.  I think she would benefit from a 50% reduction in cholesterol given the fact that she has at least intermediate coronary risk was some evidence of coronary artery disease. I would therefore recommend starting a low-dose statin and increasing that as tolerated.  Darlene Hawkins returns today for follow-up. She recently had a repeat lipid profile which showed an elevated LDL-P over 1600 with LDL C close to 160. She has a lot of small to medium-sized LDL particles putting her at increased risk. When I spoke with her about Crestor she said she was able to take the medicine only a few days and then developed not only muscle pains but significant constipation which  caused her to discontinue the medicine. She is therefore been off of medicine for several months. Cholesterol profile looks essentially unchanged despite some dietary changes.  PMHx:  Past Medical History  Diagnosis Date  . Hypertension   . Menopause syndrome   . Vitamin D deficiency     Past Surgical History  Procedure Laterality Date  . Knee arthroscopy  2005    Left  . Cesarean section  6/95, 4/97  . Tubal ligation  4/97  . Endometrial ablation w/ novasure  2010  . Knee arthroscopy w/ acl reconstruction  1984    left    FAMHx:  Family History  Problem Relation Age of Onset  . Hypertension Mother   . Osteoporosis Mother   . Alcohol abuse Maternal Grandfather   . Cirrhosis Maternal Grandfather     liver cancer  . Lung cancer Paternal Grandmother   . Heart disease Paternal Grandfather     SOCHx:   reports that she has never smoked. She has never used smokeless tobacco. She reports that she drinks about 2.4 oz of alcohol per week. She reports that she does not use illicit drugs.  ALLERGIES:  No Known Allergies  ROS: A comprehensive review of systems was negative.  HOME MEDS: Current Outpatient Prescriptions  Medication Sig Dispense Refill  . lisinopril (PRINIVIL,ZESTRIL) 5 MG tablet Take 1 tablet (5 mg total) by mouth daily. 90 tablet 3  . pravastatin (PRAVACHOL) 40 MG tablet Take 1 tablet (40 mg total) by mouth every evening. 90 tablet 3   No current facility-administered medications for this visit.    LABS/IMAGING:  No results found for this or any previous visit (from the past 48 hour(s)). No results found.  WEIGHTS: Wt Readings from Last 3 Encounters:  01/19/16 160 lb 8 oz (72.802 kg)  10/12/15 157 lb (71.215 kg)  08/18/15 158 lb (71.668 kg)    VITALS: BP 112/94 mmHg  Pulse 86  Ht 5\' 3"  (1.6 m)  Wt 160 lb 8 oz (72.802 kg)  BMI 28.44 kg/m2  EXAM: Deferred  EKG: Deferred  ASSESSMENT: 1. CAC of 1.5, mid LAD  (2016) 2. Hypertension-controlled 3. Dyslipidemia  PLAN: 1.   Darlene Hawkins tried Crestor but had significant side effects. I'm still having that we try different statin, perhaps a natural statin such as pravastatin. I recommend starting 40 mg pravastatin daily. We'll also check a CK and compress metabolic profile one week and repeat a lipid profile in 3 months. I'll plan to see her back at that time to discuss it. Advised that she consider taking coenzyme Q10 100 mg daily with her statin.  Darlene NoseKenneth C. Kostas Marrow, MD, [facility] Attending Cardiologist [facility]  Darlene Hawkins 01/19/2016, 4:07 PM

## 2016-01-20 ENCOUNTER — Telehealth: Payer: Self-pay | Admitting: Internal Medicine

## 2016-01-20 MED ORDER — PRAVASTATIN SODIUM 40 MG PO TABS: 40.0000 mg | ORAL_TABLET | Freq: Every evening | ORAL | Status: DC

## 2016-01-20 NOTE — Telephone Encounter (Signed)
Follow up      Pt request to be called when the pravastatin has been called in to [facility].

## 2016-01-20 NOTE — Telephone Encounter (Signed)
Rx sent to requested pharmacy. Rx sent to previous pharmacy discontinued. Pt notified.

## 2016-01-20 NOTE — Telephone Encounter (Signed)
°*  STAT* If patient is at the pharmacy, call can be transferred to refill team.   1. Which medications need to be refilled? (please list name of each medication and dose if known) Pravastatin   2. Which pharmacy/location (including street and city if local pharmacy) is medication to be sent to? [facility] [address] [place]  3. Do they need a 30 day or 90 day supply? 30

## 2016-01-31 NOTE — Addendum Note (Signed)
Addended by: Evans LanceSTOVER, Council Munguia W on: 01/31/2016 05:13 PM   Modules accepted: Orders

## 2016-02-02 LAB — COMPREHENSIVE METABOLIC PANEL
ALT: 13 U/L (ref 6–29)
AST: 17 U/L (ref 10–35)
Albumin: 4.4 g/dL (ref 3.6–5.1)
Alkaline Phosphatase: 55 U/L (ref 33–130)
BUN: 23 mg/dL (ref 7–25)
CHLORIDE: 103 mmol/L (ref 98–110)
CO2: 26 mmol/L (ref 20–31)
Calcium: 9.5 mg/dL (ref 8.6–10.4)
Creat: 0.89 mg/dL (ref 0.50–1.05)
GLUCOSE: 91 mg/dL (ref 65–99)
POTASSIUM: 4.7 mmol/L (ref 3.5–5.3)
Sodium: 139 mmol/L (ref 135–146)
Total Bilirubin: 0.4 mg/dL (ref 0.2–1.2)
Total Protein: 6.9 g/dL (ref 6.1–8.1)

## 2016-02-02 LAB — CK: CK TOTAL: 71 U/L (ref 7–177)

## 2016-02-03 ENCOUNTER — Telehealth: Payer: Self-pay | Admitting: Internal Medicine

## 2016-02-03 NOTE — Telephone Encounter (Signed)
Pt is calling to see if her lab results are available . Please f/u with pt..  Thanks

## 2016-02-03 NOTE — Telephone Encounter (Signed)
Pt aware results pending, she verbalized she is OK w phone call next week.

## 2016-03-22 ENCOUNTER — Telehealth: Payer: Self-pay | Admitting: Internal Medicine

## 2016-03-22 NOTE — Telephone Encounter (Signed)
Discussed w patient. She has scheduled lab draw next week and understands we will call w lipid study results, and that she should still follow up at recommended interval. She cannot do July but states she will call back when she is aware of her August or September availability. Pt voiced thanks for the call.

## 2016-03-22 NOTE — Telephone Encounter (Signed)
NEw Message  Pt call requesting to speak with RN. Pt canceled appt on 7/20 and wanted to ask RN does she need an appt or can Dr. Rennis GoldenHilty read her cholesterol results to her via phone.. Please call back to discuss

## 2016-04-08 LAB — CARDIO IQ(R) ADVANCED LIPID PANEL
APOLIPOPROTEIN (CARDIO IQ ADV LIPID PANEL): 92 mg/dL (ref 49–103)
Cholesterol, Total: 167 mg/dL (ref 125–200)
Cholesterol/HDL Ratio: 3.8 calc (ref <=5.0)
HDL CHOLESTEROL (CARDIO IQ ADV LIPID PANEL): 44 mg/dL — AB (ref >=46)
LDL CHOLESTEROL CALCULATED (CARDIO IQ ADV LIPID PANEL): 99 mg/dL
LDL LARGE: 4622 nmol/L — AB (ref 5038–17886)
LDL Medium: 251 nmol/L (ref 121–397)
LDL Particle Number: 1288 nmol/L (ref 1016–2185)
LDL Peak Size: 214.4 Angstrom — ABNORMAL LOW (ref >=218.2)
LDL Small: 295 nmol/L (ref 115–386)
Non-HDL Cholesterol: 123 mg/dL
Triglycerides: 119 mg/dL

## 2016-04-11 ENCOUNTER — Telehealth: Payer: Self-pay | Admitting: Internal Medicine

## 2016-04-11 MED ORDER — PRAVASTATIN SODIUM 40 MG PO TABS: 40.0000 mg | ORAL_TABLET | Freq: Every evening | ORAL | Status: DC

## 2016-04-11 NOTE — Telephone Encounter (Signed)
Patient returned call. Provided lab results. She would like pravastatin sent in for 90 day supply. Rx(s) sent to pharmacy electronically.

## 2016-04-11 NOTE — Addendum Note (Signed)
Addended by: Lindell SparELKINS, Crescencio Jozwiak M on: 04/11/2016 12:01 PM   Modules accepted: Orders

## 2016-04-11 NOTE — Telephone Encounter (Signed)
LM w/results on cell phone VM (OK per DPR)

## 2016-04-11 NOTE — Telephone Encounter (Signed)
Pt would like lab results from 04-04-16 please.

## 2016-04-11 NOTE — Telephone Encounter (Signed)
Rx(s) sent to pharmacy electronically.  

## 2016-04-16 ENCOUNTER — Ambulatory Visit: Payer: 59 | Admitting: Internal Medicine

## 2016-04-19 ENCOUNTER — Ambulatory Visit: Payer: 59 | Admitting: Internal Medicine

## 2016-05-01 ENCOUNTER — Other Ambulatory Visit: Payer: Self-pay

## 2016-07-04 ENCOUNTER — Ambulatory Visit (INDEPENDENT_AMBULATORY_CARE_PROVIDER_SITE_OTHER): Payer: 59 | Admitting: Internal Medicine

## 2016-07-04 ENCOUNTER — Encounter: Payer: Self-pay | Admitting: Internal Medicine

## 2016-07-04 VITALS — BP 118/76 | HR 68 | Ht 63.0 in | Wt 160.0 lb

## 2016-07-04 DIAGNOSIS — I10 Essential (primary) hypertension: Secondary | ICD-10-CM

## 2016-07-04 DIAGNOSIS — R931 Abnormal findings on diagnostic imaging of heart and coronary circulation: Secondary | ICD-10-CM

## 2016-07-04 DIAGNOSIS — E785 Hyperlipidemia, unspecified: Secondary | ICD-10-CM

## 2016-07-04 MED ORDER — EZETIMIBE 10 MG PO TABS: 10.0000 mg | ORAL_TABLET | Freq: Every day | ORAL | 3 refills | Status: DC

## 2016-07-04 NOTE — Progress Notes (Signed)
OFFICE NOTE  Chief Complaint:  Routine follow-up  Primary Care Physician: Serita Kyle, MD  HPI:  Darlene Hawkins is a pleasant 55 year old female is coming referred to me by Dr. cousins for evaluation of an abnormal cholesterol profile. Her past medical history is limited to mild hypertension on low-dose lisinopril. There is a remote history of coronary disease in her grandfather but no first-degree relatives with any known coronary disease. She is generally physically active but has had an elevated cholesterol in the past. Recent cholesterol testing shows an LDL particle number of 1940, LDL-C of 171, HDL 47, triglycerides 152 and total cholesterol 248. She reports being asymptomatic with exercise and denies any chest pain, shortness of breath or fatigue with exercise. She has no palpitations or any other worrisome symptoms.  Darlene Hawkins returns today for follow-up of her coronary artery calcium score. This demonstrated no extracardiac findings. She had a small punctate area of calcium in the mid LAD. The calcium score was 1.5, which is 78th percentile for age and sex matched controls. We discussed the relevance of some mild coronary artery calcium, which may not be that atypical given her age in the 61s, but does suggest coronary disease.  I think she would benefit from a 50% reduction in cholesterol given the fact that she has at least intermediate coronary risk was some evidence of coronary artery disease. I would therefore recommend starting a low-dose statin and increasing that as tolerated.  Darlene Hawkins returns today for follow-up. She recently had a repeat lipid profile which showed an elevated LDL-P over 1600 with LDL C close to 160. She has a lot of small to medium-sized LDL particles putting her at increased risk. When I spoke with her about Crestor she said she was able to take the medicine only a few days and then developed not only muscle pains but significant constipation  which caused her to discontinue the medicine. She is therefore been off of medicine for several months. Cholesterol profile looks essentially unchanged despite some dietary changes.  07/04/2016  Darlene Hawkins returns today for follow-up. She is tolerated pravastatin without any significant leg cramps. This is resulted in a marked improvement in her cholesterol profile. Her LDL particle number is decreased from 1664 to 1288. Total cholesterol went from 233-167 and calculated LDL is now 99. We'll LDL however with her coronary artery calcification would be below 70 and aggressive therapy may be able to get her there. Other than that she's been asymptomatic over the past year.  PMHx:  Past Medical History:  Diagnosis Date  . Hypertension   . Menopause syndrome   . Vitamin D deficiency     Past Surgical History:  Procedure Laterality Date  . CESAREAN SECTION  6/95, 4/97  . ENDOMETRIAL ABLATION W/ NOVASURE  2010  . KNEE ARTHROSCOPY  2005   Left  . KNEE ARTHROSCOPY W/ ACL RECONSTRUCTION  1984   left  . TUBAL LIGATION  4/97    FAMHx:  Family History  Problem Relation Age of Onset  . Alcohol abuse Maternal Grandfather   . Cirrhosis Maternal Grandfather     liver cancer  . Lung cancer Paternal Grandmother   . Heart disease Paternal Grandfather   . Hypertension Mother   . Osteoporosis Mother     SOCHx:   reports that she has never smoked. She has never used smokeless tobacco. She reports that she drinks about 2.4 oz of alcohol per week . She reports that she does not  use drugs.  ALLERGIES:  Allergies  Allergen Reactions  . Crestor [Rosuvastatin Calcium] Other (See Comments)    Myalgias, constipation    ROS: A comprehensive review of systems was negative.  HOME MEDS: Current Outpatient Prescriptions  Medication Sig Dispense Refill  . lisinopril (PRINIVIL,ZESTRIL) 5 MG tablet Take 1 tablet (5 mg total) by mouth daily. 90 tablet 3  . pravastatin (PRAVACHOL) 40 MG tablet Take 1  tablet (40 mg total) by mouth every evening. 90 tablet 2   No current facility-administered medications for this visit.     LABS/IMAGING: No results found for this or any previous visit (from the past 48 hour(s)). No results found.  WEIGHTS: Wt Readings from Last 3 Encounters:  07/04/16 160 lb (72.6 kg)  01/19/16 160 lb 8 oz (72.8 kg)  10/12/15 157 lb (71.2 kg)    VITALS: BP 140/90   Pulse 68   Ht 5\' 3"  (1.6 m)   Wt 160 lb (72.6 kg)   BMI 28.34 kg/m   EXAM: General appearance: alert and no distress Neck: no carotid bruit and no JVD Lungs: clear to auscultation bilaterally Heart: regular rate and rhythm Abdomen: soft, non-tender; bowel sounds normal; no masses,  no organomegaly Extremities: extremities normal, atraumatic, no cyanosis or edema Pulses: 2+ and symmetric Skin: Skin color, texture, turgor normal. No rashes or lesions Neurologic: Grossly normal Psych: Pleasant  EKG: Sinus rhythm at 68  ASSESSMENT: 1. CAC of 1.5, mid LAD (2016) 2. Hypertension-controlled 3. Dyslipidemia  PLAN: 1.   Darlene Hawkins has had improvement in her cholesterol on pravastatin. She seems to be tolerating this but I'm concerned that she will not tolerate an increased dose. Her blood pressure was elevated initially 140/90 however recheck was 118/76. Goal cholesterol would be LDL less than 70 or particle number less than 1000. I think she benefit from addition of Zetia 10 mg daily to get her to that number. We'll start that today and plan to recheck of her cholesterol 3 months. Follow-up with me annually or sooner as necessary.   [name]. Hilty, MD, [facility] Attending Cardiologist [facility]  [name] Hilty 07/04/2016, 4:40 PM

## 2016-07-04 NOTE — Patient Instructions (Signed)
Medication Instructions:  START Zetia 10mg  Take 1 tab by mouth daily.  Labwork: Your physician recommends that you return for lab work in: 3 months for Lipids  Testing/Procedures: None   Follow-Up: Your physician wants you to follow-up in: 12 months with Dr Rennis GoldenHilty. You will receive a reminder letter in the mail two months in advance. If you don't receive a letter, please call our office to schedule the follow-up appointment.   Any Other Special Instructions Will Be Listed Below (If Applicable).     If you need a refill on your cardiac medications before your next appointment, please call your pharmacy.

## 2016-07-17 ENCOUNTER — Other Ambulatory Visit: Payer: Self-pay

## 2016-07-17 MED ORDER — PRAVASTATIN SODIUM 40 MG PO TABS: 40.0000 mg | ORAL_TABLET | Freq: Every evening | ORAL | 2 refills | Status: DC

## 2016-08-02 ENCOUNTER — Other Ambulatory Visit: Payer: Self-pay

## 2016-08-02 MED ORDER — LISINOPRIL 5 MG PO TABS: 5.0000 mg | ORAL_TABLET | Freq: Every day | ORAL | 3 refills | Status: DC

## 2016-08-06 ENCOUNTER — Other Ambulatory Visit: Payer: Self-pay | Admitting: Internal Medicine

## 2016-08-06 ENCOUNTER — Telehealth: Payer: Self-pay | Admitting: Internal Medicine

## 2016-08-06 MED ORDER — LISINOPRIL 5 MG PO TABS: 5.0000 mg | ORAL_TABLET | Freq: Every day | ORAL | 3 refills | Status: DC

## 2016-08-06 NOTE — Telephone Encounter (Signed)
Rx request sent to pharmacy.  

## 2016-08-06 NOTE — Telephone Encounter (Signed)
°*  STAT* If patient is at the pharmacy, call can be transferred to refill team.   1. Which medications need to be refilled? (please list name of each medication and dose if knownLisinopril  2. Which pharmacy/location (including street and city if local pharmacy) is medication to be sent to?[facility] [address],[place]. Do they need a 30 day or 90 day supply? 90 and refills

## 2016-09-14 ENCOUNTER — Telehealth: Payer: Self-pay | Admitting: Internal Medicine

## 2016-09-14 NOTE — Telephone Encounter (Signed)
New message  Pt c/o medication issue:  1. Name of Medication: Biotin   2. How are you currently taking this medication (dosage and times per day)?   3. Are you having a reaction (difficulty breathing--STAT)? no  4. What is your medication issue? Per pt would like to know would it be okay to take this supplement with her medications prescribed by Dr. Rennis GoldenHilty. Please call back to discuss

## 2016-09-14 NOTE — Telephone Encounter (Signed)
Returned patient call and made aware of pharmacist recommendations:  Darlene Hawkins, [facility]PH    There are no drug interactions with her current cardiac medications. As stated previously caffeine may be concern due to cardiac concerns, especially elevated blood pressure.       Advised to monitor BP and HR and call with further questions and/or concerns.  Pt verbalized understanding and denies further questions at this time.

## 2016-09-14 NOTE — Telephone Encounter (Signed)
Returned patient call-pt states she would like to start taking "Bio-lean" a herbal supplement that consist of green tea extract and caffeine.  Pt unsure of amount of caffeine in supplement.  Pt requesting verification that this herbal supplement will be okay to take with current cardiac medications prescribed by MD Hilty.  Voiced concern with caffeine but made aware I would route to pharmacist for verification.  Routed to pharmacist for review.  Pt aware and verbalized understanding.

## 2016-09-14 NOTE — Telephone Encounter (Signed)
There are no drug interactions with her current cardiac medications. As stated previously caffeine may be concern due to cardiac concerns, especially elevated blood pressure.

## 2016-10-15 LAB — LIPID PANEL
CHOL/HDL RATIO: 2.6 ratio (ref <5.0)
CHOLESTEROL: 121 mg/dL (ref <200)
HDL: 47 mg/dL — ABNORMAL LOW (ref >50)
LDL CALC: 60 mg/dL (ref <100)
Triglycerides: 68 mg/dL (ref <150)
VLDL: 14 mg/dL (ref <30)

## 2016-11-09 ENCOUNTER — Other Ambulatory Visit: Payer: Self-pay | Admitting: *Deleted

## 2016-11-09 MED ORDER — LISINOPRIL 5 MG PO TABS: 5.0000 mg | ORAL_TABLET | Freq: Every day | ORAL | 2 refills | Status: DC

## 2016-11-09 MED ORDER — PRAVASTATIN SODIUM 40 MG PO TABS: 40.0000 mg | ORAL_TABLET | Freq: Every evening | ORAL | 2 refills | Status: DC

## 2017-01-08 ENCOUNTER — Telehealth: Payer: Self-pay | Admitting: Internal Medicine

## 2017-01-08 NOTE — Telephone Encounter (Signed)
New Message     Pt c/o medication issue:  1. Name of Medication:  pravastatin (PRAVACHOL) 40 MG tablet Take 1 tablet (40 mg total) by mouth every evening.   ezetimibe (ZETIA) 10 MG tablet(Expired) Take 1 tablet (10 mg total) by mouth daily     2. How are you currently taking this medication (dosage and times per day)?   3. Are you having a reaction (difficulty breathing--STAT)? Giving her pains in her hips and , making her foggy and blah feeling   4. What is your medication issue?  Needs suggestion for supplements or something else

## 2017-01-08 NOTE — Telephone Encounter (Signed)
Returned call to patient-reports she can not take her cholesterol medication anymore, it makes her feel foggy, body aches, "I can't explain it", "but I cant do it anymore".  States it has been occurring for a while but she gave it time to see if it would resolve. Wondering if there are any recommendations for supplements to take instead.  Patient states she would like to have a "holistic" approach rather than a medicinal approach.    Advised I would route to MD and pharm for recommendations.

## 2017-01-09 NOTE — Telephone Encounter (Signed)
LM for patient with MD advice.  

## 2017-01-09 NOTE — Telephone Encounter (Signed)
I agree - she will have to make major dietary changes - suggesting a mediterranean or vegan diet would be best in addition to lifestyle, but not likely to reach her cholesterol goals with that alone.  Dr. Rexene Edison

## 2017-01-09 NOTE — Telephone Encounter (Signed)
Is she referring to pravastatin, ezetimibe or both?  I don't know of any "holistic" therapy that will lower cholesterol.   If she won't take the meds she'll have to make serious changes to her diet and exercise, but that won't keep her LDL as low as the meds.  Same with trying red yeast rice.

## 2019-02-10 LAB — HM HEPATITIS C SCREENING LAB: HM Hepatitis Screen: NEGATIVE

## 2020-05-28 ENCOUNTER — Emergency Department: Admit: 2020-05-28 | Discharge status: Against Medical Advice | Payer: Self-pay

## 2020-05-28 ENCOUNTER — Other Ambulatory Visit: Payer: Self-pay

## 2020-05-28 ENCOUNTER — Emergency Department (INDEPENDENT_AMBULATORY_CARE_PROVIDER_SITE_OTHER)
Admission: EM | Admit: 2020-05-28 | Discharge: 2020-05-28 | Disposition: A | Discharge status: Home / Self Care | Payer: Managed Care, Other (non HMO) | Source: Home / Self Care | Attending: Emergency Medicine | Admitting: Emergency Medicine

## 2020-05-28 DIAGNOSIS — J069 Acute upper respiratory infection, unspecified: Secondary | ICD-10-CM

## 2020-05-28 DIAGNOSIS — H6503 Acute serous otitis media, bilateral: Secondary | ICD-10-CM

## 2020-05-28 DIAGNOSIS — J301 Allergic rhinitis due to pollen: Secondary | ICD-10-CM

## 2020-05-28 MED ORDER — AMOXICILLIN 875 MG PO TABS: ORAL_TABLET | ORAL | 0 refills | Status: DC

## 2020-05-28 NOTE — ED Triage Notes (Signed)
Pt states that her ears are irritated. She states that she has some facial pain, pressure and congestion. x4 days. Pt states that she is vaccinated. Pt states that she had a rapid done already this week which was negative.

## 2020-05-28 NOTE — Discharge Instructions (Signed)
You likely have viral upper respiratory infection, and possibly flareup of seasonal allergies, with some clear fluid behind both ears but no sign of ear infection. Sinus congestion is from all the above. No definite sign of sinus infection, but I am printing a prescription for amoxicillin to fill if you develop sinusitis symptoms such as worsening pain of maxillary sinuses, discolored mucus, fever, or persistent symptoms for more than 7 to 10 days. Start using Flonase today, as that will likely help your symptoms after a few days.

## 2020-05-28 NOTE — ED Provider Notes (Signed)
[facility]    CSN: [id number] Arrival date & time: 05/28/20  1006      History   Chief Complaint Chief Complaint  Patient presents with  . Otalgia    HPI Darlene Hawkins is a 59 y.o. female.   HPI Patient states that both ears are irritated. She complains of some facial pain and pressure in sinuses with congestion for 5 days, with clear rhinorrhea that progressed to slightly yellow rhinorrhea.  No blood.  May have had a low-grade fever. No cough or chest pain or change of taste or smell. She states she has received complete dosages of Covid vaccine. She did a rapid home Covid test several days ago that she reports was negative.  Pertinent items noted in HPI and remainder of comprehensive ROS otherwise negative.  Past Medical History:  Diagnosis Date  . Hypertension   . Menopause syndrome   . Vitamin D deficiency     Patient Active Problem List   Diagnosis Date Noted  . Agatston coronary artery calcium score less than 100 10/13/2015  . Menorrhagia 10/28/2011  . Hyperlipidemia 08/30/2011  . Hypertension   . Menopause syndrome   . Vitamin D deficiency     Past Surgical History:  Procedure Laterality Date  . CESAREAN SECTION  6/95, 4/97  . ENDOMETRIAL ABLATION W/ NOVASURE  2010  . KNEE ARTHROSCOPY  2005   Left  . KNEE ARTHROSCOPY W/ ACL RECONSTRUCTION  1984   left  . TUBAL LIGATION  4/97    OB History    Gravida  2   Para  2   Term      Preterm      AB      Living        SAB      TAB      Ectopic      Multiple      Live Births               Home Medications    Prior to Admission medications   Medication Sig Start Date End Date Taking? Authorizing Provider  lisinopril (PRINIVIL,ZESTRIL) 5 MG tablet Take 1 tablet (5 mg total) by mouth daily. 11/09/16  Yes Hilty, Lisette Abu, MD  lisinopril (ZESTRIL) 10 MG tablet Take 10 mg by mouth daily. 12/16/19  Yes [provider]  pravastatin (PRAVACHOL) 40 MG tablet Take 1  tablet (40 mg total) by mouth every evening. 11/09/16  Yes Hilty, Lisette Abu, MD  amoxicillin (AMOXIL) 875 MG tablet Take 1 twice a day X 10 days. 05/28/20   Lajean Manes, MD  ezetimibe (ZETIA) 10 MG tablet Take 1 tablet (10 mg total) by mouth daily. 07/04/16 10/02/16  Chrystie Nose, MD    Family History Family History  Problem Relation Age of Onset  . Alcohol abuse Maternal Grandfather   . Cirrhosis Maternal Grandfather        liver cancer  . Lung cancer Paternal Grandmother   . Heart disease Paternal Grandfather   . Hypertension Mother   . Osteoporosis Mother     Social History Social History   Tobacco Use  . Smoking status: Never Smoker  . Smokeless tobacco: Never Used  Substance Use Topics  . Alcohol use: Yes    Alcohol/week: 4.0 standard drinks    Types: 4 Glasses of wine per week  . Drug use: No     Allergies   Crestor [rosuvastatin calcium]   Review of Systems Review of Systems  Physical Exam Triage Vital Signs ED Triage Vitals  Enc Vitals Group     BP 05/28/20 1028 (!) 135/93     Pulse Rate 05/28/20 1028 99     Resp --      Temp 05/28/20 1028 99.1 F (37.3 C)     Temp Source 05/28/20 1028 Oral     SpO2 05/28/20 1028 96 %     Weight 05/28/20 1025 161 lb (73 kg)     Height 05/28/20 1025 5\' 3"  (1.6 m)     Head Circumference --      Peak Flow --      Pain Score --      Pain Loc --      Pain Edu? --      Excl. in GC? --    No data found.  Updated Vital Signs BP (!) 135/93 (BP Location: Left Arm)   Pulse 99   Temp 99.1 F (37.3 C) (Oral)   Ht 5\' 3"  (1.6 m)   Wt 73 kg   SpO2 96%   BMI 28.52 kg/m   Visual Acuity Right Eye Distance:   Left Eye Distance:   Bilateral Distance:    Right Eye Near:   Left Eye Near:    Bilateral Near:     Physical Exam Vitals and nursing note reviewed.  Constitutional:      General: She is not in acute distress.    Appearance: She is well-developed.  HENT:     Head: Normocephalic and atraumatic.     Right  Ear: Tympanic membrane, ear canal and external ear normal.     Left Ear: Tympanic membrane, ear canal and external ear normal.     Ears:     Comments: TMs normal bilaterally except minimal air-fluid levels bilaterally, consistent with serous otitis    Nose: Mucosal edema and rhinorrhea present.     Right Sinus: Maxillary sinus tenderness present.     Left Sinus: Maxillary sinus tenderness present.     Mouth/Throat:     Mouth: No oral lesions.     Pharynx: No oropharyngeal exudate.  Eyes:     General: No scleral icterus.       Right eye: No discharge.        Left eye: No discharge.  Cardiovascular:     Rate and Rhythm: Normal rate and regular rhythm.     Heart sounds: Normal heart sounds.  Pulmonary:     Effort: Pulmonary effort is normal.     Breath sounds: Normal breath sounds. No wheezing or rales.  Musculoskeletal:     Cervical back: Neck supple.  Lymphadenopathy:     Cervical: No cervical adenopathy.  Skin:    General: Skin is warm and dry.  Neurological:     Mental Status: She is alert and oriented to person, place, and time.      UC Treatments / Results  Labs (all labs ordered are listed, but only abnormal results are displayed) Labs Reviewed - No data to display  EKG   Radiology No results found.  Procedures Procedures (including critical care time)  Medications Ordered in UC Medications - No data to display  Initial Impression / Assessment and Plan / UC Course  I have reviewed the triage vital signs and the nursing notes.  Pertinent labs & imaging results that were available during my care of the patient were reviewed by me and considered in my medical decision making (see chart for details).  Final Clinical Impressions(s) / UC Diagnoses   Final diagnoses:  Upper respiratory tract infection, unspecified type  Non-recurrent acute serous otitis media of both ears  Seasonal allergic rhinitis due to pollen     Discharge Instructions     You  likely have viral upper respiratory infection, and possibly flareup of seasonal allergies, with some clear fluid behind both ears but no sign of ear infection. Sinus congestion is from all the above. No definite sign of sinus infection, but I am printing a prescription for amoxicillin to fill if you develop sinusitis symptoms such as worsening pain of maxillary sinuses, discolored mucus, fever, or persistent symptoms for more than 7 to 10 days. Start using Flonase today, as that will likely help your symptoms after a few days.    ED Prescriptions    Medication Sig Dispense Auth. Provider   amoxicillin (AMOXIL) 875 MG tablet Take 1 twice a day X 10 days. 20 tablet Lajean Manes, MD     PDMP not reviewed this encounter.   Lajean Manes, MD 05/28/20 [phone]

## 2022-07-16 LAB — HM COLONOSCOPY

## 2023-02-12 LAB — HM MAMMOGRAPHY

## 2023-07-28 LAB — LAB REPORT - SCANNED
A1c: 5.6
EGFR: 64

## 2023-09-11 ENCOUNTER — Encounter: Payer: Self-pay | Admitting: Family Medicine

## 2023-09-11 ENCOUNTER — Ambulatory Visit (INDEPENDENT_AMBULATORY_CARE_PROVIDER_SITE_OTHER): Payer: BC Managed Care – PPO | Admitting: Family Medicine

## 2023-09-11 VITALS — BP 160/100 | HR 86 | Temp 98.0°F | Resp 18 | Ht 63.0 in | Wt 168.6 lb

## 2023-09-11 DIAGNOSIS — R5382 Chronic fatigue, unspecified: Secondary | ICD-10-CM

## 2023-09-11 DIAGNOSIS — Z7689 Persons encountering health services in other specified circumstances: Secondary | ICD-10-CM

## 2023-09-11 DIAGNOSIS — I10 Essential (primary) hypertension: Secondary | ICD-10-CM | POA: Diagnosis not present

## 2023-09-11 DIAGNOSIS — R0683 Snoring: Secondary | ICD-10-CM | POA: Diagnosis not present

## 2023-09-11 NOTE — Progress Notes (Signed)
New Patient Office Visit  Subjective    Patient ID: Darlene Hawkins, female    DOB: [birth year]  Age: 62 y.o. MRN: [id number]  CC:  Chief Complaint  Patient presents with   Establish Care    Patient is here to establish care with a new PCP, She would like to discuss fatigue and menopause issues    HPI Darlene Hawkins presents to establish care  She reports she has an OB Dr Cherly Hensen and also goes to [employer].  Pt reports fatigue and this is why she started seeing the [facility]. She reports fatigue for a long time. She had labs done and was normal other than high Vitamin D. She has cut back on this since then. She reports she does snore per her husband.  She is on Lisinopril 10mg  daily for HTN. She reports it's always high in the doctor's office initially but it does come down to normal at the end of the visit.  She has been taking Zetia 10mg  daily for HLD due to intolerance to statins.  She did retire last year.   Outpatient Encounter Medications as of 09/11/2023  Medication Sig   Berberine Chloride (BERBERINE HCI PO) Take by mouth.   Cholecalciferol (VITAMIN D3 PO) Take by mouth. With k-2   co-enzyme Q-10 30 MG capsule Take 30 mg by mouth 3 (three) times daily.   Cyanocobalamin (VITAMIN B 12 PO) Take by mouth.   ezetimibe (ZETIA) 10 MG tablet Take by mouth.   lisinopril (ZESTRIL) 10 MG tablet Take 10 mg by mouth daily.   MAGNESIUM PO Take by mouth.   Multiple Vitamin (MULTIVITAMIN) capsule Take 1 capsule by mouth daily.   Probiotic Product (PROBIOTIC ADVANCED PO) Take by mouth.   [DISCONTINUED] amoxicillin (AMOXIL) 875 MG tablet Take 1 twice a day X 10 days.   [DISCONTINUED] ezetimibe (ZETIA) 10 MG tablet Take 1 tablet (10 mg total) by mouth daily.   [DISCONTINUED] lisinopril (PRINIVIL,ZESTRIL) 5 MG tablet Take 1 tablet (5 mg total) by mouth daily.   [DISCONTINUED] pravastatin (PRAVACHOL) 40 MG tablet Take 1 tablet (40 mg total) by mouth every  evening.   No facility-administered encounter medications on file as of 09/11/2023.    Past Medical History:  Diagnosis Date   Hypertension    Menopause syndrome    Vitamin D deficiency     Past Surgical History:  Procedure Laterality Date   CESAREAN SECTION  6/95, 4/97   ENDOMETRIAL ABLATION W/ NOVASURE  2010   KNEE ARTHROSCOPY  2005   Left   KNEE ARTHROSCOPY W/ ACL RECONSTRUCTION  1984   left   TUBAL LIGATION  4/97    Family History  Problem Relation Age of Onset   Alcohol abuse Maternal Grandfather    Cirrhosis Maternal Grandfather        liver cancer   Lung cancer Paternal Grandmother    Heart disease Paternal Grandfather    Hypertension Mother    Osteoporosis Mother     Social History   Socioeconomic History   Marital status: Married    Spouse name: Not on file   Number of children: 2   Years of education: 16   Highest education level: Not on file  Occupational History   Occupation: account Buyer, retail: [employer]  Tobacco Use   Smoking status: Never    Passive exposure: Never   Smokeless tobacco: Never  Vaping Use   Vaping status: Never Used  Substance and  Sexual Activity   Alcohol use: Yes    Alcohol/week: 4.0 standard drinks of alcohol    Types: 4 Glasses of wine per week    Comment: occ   Drug use: No   Sexual activity: Yes    Birth control/protection: Surgical  Other Topics Concern   Not on file  Social History Narrative   epworth sleepiness scale = 6 (08/18/15)   Social Determinants of Health   Financial Resource Strain: Low Risk  (08/10/2022)   Received from [facility]   Overall Financial Resource Strain (CARDIA)    Difficulty of Paying Living Expenses: Not hard at all  Food Insecurity: No Food Insecurity (08/10/2022)   [facility] from [facility]   Hunger Vital Sign    Worried About Running Out of Food in the Last Year: Never true    Ran Out of Food in the Last Year: Never true  Transportation Needs: Not on file   Physical Activity: Insufficiently Active (08/10/2022)   Received from [facility]   Exercise Vital Sign    Days of Exercise per Week: 3 days    Minutes of Exercise per Session: 40 min  Stress: Stress Concern Present (08/10/2022)   Received from [facility] - Occupational Stress Questionnaire    Feeling of Stress : To some extent  Social Connections: Socially Integrated (08/10/2022)   Received from [facility]   Social Network    How would you rate your social network (family, work, friends)?: Good participation with social networks  Intimate Partner Violence: Not At Risk (08/10/2022)   Received from [facility]   HITS    Over the last 12 months how often did your partner physically hurt you?: Never    Over the last 12 months how often did your partner insult you or talk down to you?: Rarely    Over the last 12 months how often did your partner threaten you with physical harm?: Never    Over the last 12 months how often did your partner scream or curse at you?: Never    Review of Systems  Constitutional:  Positive for malaise/fatigue.  All other systems reviewed and are negative.       Objective    BP (!) 175/106   Pulse 86   Temp 98 F (36.7 C) (Oral)   Resp 18   Ht 5\' 3"  (1.6 m)   Wt 168 lb 9.6 oz (76.5 kg)   SpO2 97%   BMI 29.87 kg/m   Physical Exam Vitals and nursing note reviewed.  Constitutional:      Appearance: Normal appearance. She is normal weight.  HENT:     Head: Normocephalic and atraumatic.     Right Ear: External ear normal.     Left Ear: External ear normal.     Nose: Nose normal.     Mouth/Throat:     Mouth: Mucous membranes are moist.     Pharynx: Oropharynx is clear.  Eyes:     Conjunctiva/sclera: Conjunctivae normal.     Pupils: Pupils are equal, round, and reactive to light.  Cardiovascular:     Rate and Rhythm: Normal rate.  Pulmonary:     Effort: Pulmonary effort is normal.   Abdominal:     General: Abdomen is flat. Bowel sounds are normal.  Skin:    Capillary Refill: Capillary refill takes less than 2 seconds.  Neurological:     General: No focal deficit present.  Mental Status: She is alert and oriented to person, place, and time. Mental status is at baseline.  Psychiatric:        Mood and Affect: Mood normal.        Behavior: Behavior normal.        Thought Content: Thought content normal.        Judgment: Judgment normal.         Assessment & Plan:   Problem List Items Addressed This Visit   None  Encounter to establish care with new doctor  Elevated blood pressure reading in office with diagnosis of hypertension  Snoring  Chronic fatigue  Blood pressure elevated x 2 in office. BP log given to pt today to monitor and return in 6 weeks for follow up. Discussed fatigue and causes with pt. She had labs done last month and these were reviewed and abstracted. No significant findings. She does report snoring and fatigue. Could be from OSA? She would like to think about proceeding with sleep study. I've also discussed OSA causing fatigue and elevated blood pressures. Will follow up in 6 weeks for HTN and sleep.  No follow-ups on file.   Suzan Slick, MD

## 2023-09-17 ENCOUNTER — Encounter: Payer: Self-pay | Admitting: Family Medicine

## 2023-09-23 ENCOUNTER — Other Ambulatory Visit: Payer: Self-pay | Admitting: Family Medicine

## 2023-09-23 DIAGNOSIS — N951 Menopausal and female climacteric states: Secondary | ICD-10-CM

## 2023-09-23 DIAGNOSIS — I10 Essential (primary) hypertension: Secondary | ICD-10-CM

## 2023-09-23 DIAGNOSIS — R632 Polyphagia: Secondary | ICD-10-CM

## 2023-09-23 MED ORDER — LOSARTAN POTASSIUM 25 MG PO TABS: 25.0000 mg | ORAL_TABLET | Freq: Every day | ORAL | 1 refills | Status: DC

## 2023-09-23 MED ORDER — BUPROPION HCL ER (XL) 150 MG PO TB24: 150.0000 mg | ORAL_TABLET | Freq: Every day | ORAL | 1 refills | Status: DC

## 2023-09-23 NOTE — Addendum Note (Signed)
Addended by: Suzan Slick on: 09/23/2023 09:56 AM   Modules accepted: Orders

## 2023-10-23 ENCOUNTER — Encounter: Payer: Self-pay | Admitting: Family Medicine

## 2023-10-23 ENCOUNTER — Ambulatory Visit: Payer: 59 | Admitting: Family Medicine

## 2023-10-23 VITALS — BP 140/84 | HR 94 | Temp 98.1°F | Resp 18 | Ht 63.0 in | Wt 168.8 lb

## 2023-10-23 DIAGNOSIS — I10 Essential (primary) hypertension: Secondary | ICD-10-CM | POA: Diagnosis not present

## 2023-10-23 DIAGNOSIS — R632 Polyphagia: Secondary | ICD-10-CM | POA: Diagnosis not present

## 2023-10-23 MED ORDER — LOSARTAN POTASSIUM 25 MG PO TABS: 25.0000 mg | ORAL_TABLET | Freq: Every day | ORAL | 1 refills | Status: AC

## 2023-10-23 NOTE — Progress Notes (Signed)
Established Patient Office Visit  Subjective   Patient ID: Darlene Hawkins, female    DOB: 1961-08-22  Age: 63 y.o. MRN: [id number]  Chief Complaint  Patient presents with   Medical Management of Chronic Issues    Patient is here for 6 week follow up for HTN    HPI  Hypertension Pt here for 6 week follow up of HTN. Started on Losartan 25mg  daily. Taking medicine as prescribed, daily. No side effects from medicine and tolerating well. Checking blood pressures at home and has all been 140/90 or less. She needs refills on the Losartan 25mg .  Weight Pt was also started on Bupropion 150mg  daily for polyphagia and weight loss. She is yet to start this. She reports she has new grandchild arriving in a few weeks and she didn't want to alter her mood in any way. She has been trying to increase her protein intake.  Review of Systems  All other systems reviewed and are negative.     Objective:     BP (!) 140/84   Pulse 94   Temp 98.1 F (36.7 C) (Oral)   Resp 18   Ht 5\' 3"  (1.6 m)   Wt 168 lb 12.8 oz (76.6 kg)   SpO2 96%   BMI 29.90 kg/m  BP Readings from Last 3 Encounters:  10/23/23 (!) 140/84  09/11/23 (!) 160/100  05/28/20 (!) 135/93      Physical Exam Vitals and nursing note reviewed.  Constitutional:      Appearance: Normal appearance. She is normal weight.  HENT:     Head: Normocephalic and atraumatic.     Right Ear: External ear normal.     Left Ear: External ear normal.     Nose: Nose normal.     Mouth/Throat:     Mouth: Mucous membranes are moist.     Pharynx: Oropharynx is clear.  Eyes:     Conjunctiva/sclera: Conjunctivae normal.     Pupils: Pupils are equal, round, and reactive to light.  Cardiovascular:     Rate and Rhythm: Normal rate.  Pulmonary:     Effort: Pulmonary effort is normal.  Skin:    General: Skin is warm.     Capillary Refill: Capillary refill takes less than 2 seconds.  Neurological:     General: No focal deficit present.      Mental Status: She is alert and oriented to person, place, and time. Mental status is at baseline.  Psychiatric:        Mood and Affect: Mood normal.        Behavior: Behavior normal.        Thought Content: Thought content normal.        Judgment: Judgment normal.     No results found for any visits on 10/23/23.     The ASCVD Risk score (Arnett DK, et al., 2019) failed to calculate for the following reasons:   Cannot find a previous HDL lab   Cannot find a previous total cholesterol lab    Assessment & Plan:   Problem List Items Addressed This Visit       Cardiovascular and Mediastinum   Hypertension - Primary   Relevant Medications   losartan (COZAAR) 25 MG tablet   Other Visit Diagnoses       Polyphagia         HTN much better and controlled. Continue Losartan 25 mg daily. She has Wellbutrin 150mg  but yet to start it. Continue with diet and  exercise.    Return in about 6 months (around 04/21/2024) for Annual Physical.    Suzan Slick, MD

## 2023-11-20 ENCOUNTER — Encounter: Payer: Self-pay | Admitting: Family Medicine

## 2023-12-18 ENCOUNTER — Other Ambulatory Visit: Payer: Self-pay | Admitting: Family Medicine

## 2023-12-18 ENCOUNTER — Other Ambulatory Visit: Payer: Self-pay

## 2023-12-18 DIAGNOSIS — N951 Menopausal and female climacteric states: Secondary | ICD-10-CM

## 2023-12-18 DIAGNOSIS — R632 Polyphagia: Secondary | ICD-10-CM

## 2023-12-18 MED ORDER — BUPROPION HCL ER (XL) 150 MG PO TB24: 150.0000 mg | ORAL_TABLET | Freq: Every day | ORAL | 0 refills | Status: AC

## 2023-12-18 MED ORDER — BUPROPION HCL ER (XL) 150 MG PO TB24: 150.0000 mg | ORAL_TABLET | Freq: Every day | ORAL | 0 refills | Status: DC

## 2024-04-21 ENCOUNTER — Ambulatory Visit: Payer: 59 | Admitting: Family Medicine
